# Patient Record
Sex: Female | Born: 1975 | Race: White | Hispanic: No | Smoking: Current every day smoker
Health system: Southern US, Community
[De-identification: ages and names within clinical notes are randomized; demographics above are authoritative.]

## PROBLEM LIST (undated history)

## (undated) DIAGNOSIS — I10 Essential (primary) hypertension: Secondary | ICD-10-CM

## (undated) DIAGNOSIS — J45909 Unspecified asthma, uncomplicated: Secondary | ICD-10-CM

## (undated) HISTORY — PX: OTHER SURGICAL HISTORY: SHX169

## (undated) HISTORY — PX: LEG SURGERY: SHX1003

---

## 2004-06-28 ENCOUNTER — Emergency Department: Payer: Self-pay | Admitting: General Practice

## 2004-06-29 ENCOUNTER — Other Ambulatory Visit: Payer: Self-pay

## 2005-01-27 ENCOUNTER — Ambulatory Visit: Payer: Self-pay

## 2005-08-11 ENCOUNTER — Emergency Department: Payer: Self-pay | Admitting: Unknown Physician Specialty

## 2005-11-20 ENCOUNTER — Emergency Department: Payer: Self-pay | Admitting: Emergency Medicine

## 2006-01-13 ENCOUNTER — Emergency Department: Payer: Self-pay | Admitting: Internal Medicine

## 2006-02-27 ENCOUNTER — Emergency Department: Payer: Self-pay | Admitting: Emergency Medicine

## 2006-03-27 ENCOUNTER — Emergency Department: Payer: Self-pay | Admitting: Emergency Medicine

## 2006-09-27 ENCOUNTER — Emergency Department: Payer: Self-pay | Admitting: Emergency Medicine

## 2007-03-14 ENCOUNTER — Emergency Department (HOSPITAL_COMMUNITY): Admission: EM | Admit: 2007-03-14 | Discharge: 2007-03-14 | Payer: Self-pay | Admitting: Emergency Medicine

## 2007-04-09 ENCOUNTER — Emergency Department: Payer: Self-pay | Admitting: Unknown Physician Specialty

## 2008-02-27 ENCOUNTER — Emergency Department: Payer: Self-pay | Admitting: Unknown Physician Specialty

## 2008-05-13 ENCOUNTER — Emergency Department: Payer: Self-pay | Admitting: Emergency Medicine

## 2008-05-14 ENCOUNTER — Emergency Department: Payer: Self-pay | Admitting: Emergency Medicine

## 2008-05-15 ENCOUNTER — Emergency Department: Payer: Self-pay | Admitting: Emergency Medicine

## 2008-07-01 ENCOUNTER — Emergency Department: Payer: Self-pay | Admitting: Emergency Medicine

## 2009-01-31 ENCOUNTER — Emergency Department: Payer: Self-pay

## 2009-10-05 ENCOUNTER — Emergency Department: Payer: Self-pay | Admitting: Emergency Medicine

## 2009-11-03 ENCOUNTER — Emergency Department: Payer: Self-pay | Admitting: Emergency Medicine

## 2010-12-09 ENCOUNTER — Emergency Department: Payer: Self-pay | Admitting: Emergency Medicine

## 2010-12-18 ENCOUNTER — Emergency Department: Payer: Self-pay | Admitting: Emergency Medicine

## 2011-03-07 ENCOUNTER — Emergency Department: Payer: Self-pay | Admitting: Emergency Medicine

## 2011-03-27 ENCOUNTER — Emergency Department: Payer: Self-pay | Admitting: Emergency Medicine

## 2011-06-01 ENCOUNTER — Emergency Department: Payer: Self-pay | Admitting: *Deleted

## 2012-01-02 ENCOUNTER — Emergency Department: Payer: Self-pay | Admitting: Emergency Medicine

## 2012-04-02 ENCOUNTER — Emergency Department: Payer: Self-pay | Admitting: *Deleted

## 2012-04-02 LAB — COMPREHENSIVE METABOLIC PANEL
Albumin: 3.7 g/dL (ref 3.4–5.0)
Alkaline Phosphatase: 72 U/L (ref 50–136)
Bilirubin,Total: 0.4 mg/dL (ref 0.2–1.0)
Chloride: 105 mmol/L (ref 98–107)
Co2: 27 mmol/L (ref 21–32)
Creatinine: 0.63 mg/dL (ref 0.60–1.30)
EGFR (African American): >60
EGFR (Non-African Amer.): >60
Glucose: 93 mg/dL (ref 65–99)
Osmolality: 282 (ref 275–301)
SGPT (ALT): 21 U/L
Sodium: 141 mmol/L (ref 136–145)
Total Protein: 7.4 g/dL (ref 6.4–8.2)

## 2012-04-02 LAB — CBC WITH DIFFERENTIAL/PLATELET
Basophil #: 0 10*3/uL (ref 0.0–0.1)
Basophil %: 0.4 %
Eosinophil #: 0.2 10*3/uL (ref 0.0–0.7)
HCT: 44.8 % (ref 35.0–47.0)
Lymphocyte #: 2.2 10*3/uL (ref 1.0–3.6)
MCH: 32.6 pg (ref 26.0–34.0)
MCHC: 32.6 g/dL (ref 32.0–36.0)
MCV: 100 fL (ref 80–100)
Monocyte #: 0.6 x10 3/mm (ref 0.2–0.9)
Monocyte %: 5.3 %
Neutrophil #: 7.7 10*3/uL — ABNORMAL HIGH (ref 1.4–6.5)
RDW: 15.5 % — ABNORMAL HIGH (ref 11.5–14.5)

## 2012-04-02 LAB — LIPASE, BLOOD: Lipase: 129 U/L (ref 73–393)

## 2012-07-30 ENCOUNTER — Emergency Department: Payer: Self-pay | Admitting: Emergency Medicine

## 2012-07-30 LAB — URINALYSIS, COMPLETE
Bacteria: NONE SEEN
Bilirubin,UR: NEGATIVE
Blood: NEGATIVE
Glucose,UR: NEGATIVE mg/dL (ref 0–75)
Ketone: NEGATIVE
Leukocyte Esterase: NEGATIVE
Nitrite: NEGATIVE
Ph: 6 (ref 4.5–8.0)
Protein: NEGATIVE
RBC,UR: 3 /HPF (ref 0–5)
Specific Gravity: 1.024 (ref 1.003–1.030)
Squamous Epithelial: 2
WBC UR: 1 /HPF (ref 0–5)

## 2012-07-30 LAB — COMPREHENSIVE METABOLIC PANEL
Alkaline Phosphatase: 69 U/L (ref 50–136)
BUN: 11 mg/dL (ref 7–18)
Bilirubin,Total: 0.2 mg/dL (ref 0.2–1.0)
Chloride: 106 mmol/L (ref 98–107)
Co2: 29 mmol/L (ref 21–32)
EGFR (Non-African Amer.): >60
Glucose: 87 mg/dL (ref 65–99)
Osmolality: 276 (ref 275–301)
Potassium: 4.3 mmol/L (ref 3.5–5.1)
SGPT (ALT): 28 U/L (ref 12–78)
Sodium: 139 mmol/L (ref 136–145)

## 2012-07-30 LAB — WET PREP, GENITAL

## 2012-07-30 LAB — CBC
MCHC: 32.8 g/dL (ref 32.0–36.0)
Platelet: 269 10*3/uL (ref 150–440)
RDW: 15.8 % — ABNORMAL HIGH (ref 11.5–14.5)

## 2012-12-21 ENCOUNTER — Emergency Department: Payer: Self-pay | Admitting: Emergency Medicine

## 2013-01-28 ENCOUNTER — Emergency Department: Payer: Self-pay | Admitting: Emergency Medicine

## 2013-07-12 ENCOUNTER — Emergency Department: Payer: Self-pay | Admitting: Emergency Medicine

## 2014-03-06 ENCOUNTER — Emergency Department: Payer: Self-pay | Admitting: Internal Medicine

## 2014-05-05 ENCOUNTER — Emergency Department: Payer: Self-pay | Admitting: Emergency Medicine

## 2014-05-05 LAB — URINALYSIS, COMPLETE
BILIRUBIN, UR: NEGATIVE
Blood: NEGATIVE
GLUCOSE, UR: NEGATIVE mg/dL (ref 0–75)
Ketone: NEGATIVE
LEUKOCYTE ESTERASE: NEGATIVE
Nitrite: NEGATIVE
Ph: 5 (ref 4.5–8.0)
Protein: NEGATIVE
RBC,UR: 1 /HPF (ref 0–5)
Specific Gravity: 1.02 (ref 1.003–1.030)
Squamous Epithelial: 8

## 2014-05-05 LAB — COMPREHENSIVE METABOLIC PANEL
ALK PHOS: 61 U/L
ANION GAP: 3 — AB (ref 7–16)
Albumin: 3.2 g/dL — ABNORMAL LOW (ref 3.4–5.0)
BUN: 12 mg/dL (ref 7–18)
Bilirubin,Total: 0.3 mg/dL (ref 0.2–1.0)
CHLORIDE: 111 mmol/L — AB (ref 98–107)
Calcium, Total: 8.1 mg/dL — ABNORMAL LOW (ref 8.5–10.1)
Co2: 24 mmol/L (ref 21–32)
Creatinine: 0.74 mg/dL (ref 0.60–1.30)
EGFR (Non-African Amer.): >60
GLUCOSE: 123 mg/dL — AB (ref 65–99)
Osmolality: 277 (ref 275–301)
Potassium: 4.1 mmol/L (ref 3.5–5.1)
SGOT(AST): 26 U/L (ref 15–37)
SGPT (ALT): 23 U/L
Sodium: 138 mmol/L (ref 136–145)
TOTAL PROTEIN: 6.9 g/dL (ref 6.4–8.2)

## 2014-05-05 LAB — CBC
HCT: 45.7 % (ref 35.0–47.0)
HGB: 14.6 g/dL (ref 12.0–16.0)
MCH: 32.8 pg (ref 26.0–34.0)
MCHC: 32.1 g/dL (ref 32.0–36.0)
MCV: 102 fL — ABNORMAL HIGH (ref 80–100)
Platelet: 251 10*3/uL (ref 150–440)
RBC: 4.46 10*6/uL (ref 3.80–5.20)
RDW: 14.4 % (ref 11.5–14.5)
WBC: 10.4 10*3/uL (ref 3.6–11.0)

## 2014-05-05 LAB — D-DIMER(ARMC): D-Dimer: 376 ng/ml

## 2014-05-05 LAB — LIPASE, BLOOD: Lipase: 160 U/L (ref 73–393)

## 2014-11-13 ENCOUNTER — Emergency Department: Payer: Self-pay | Admitting: Internal Medicine

## 2014-11-14 ENCOUNTER — Encounter (HOSPITAL_COMMUNITY): Payer: Self-pay | Admitting: Emergency Medicine

## 2014-11-14 ENCOUNTER — Emergency Department (HOSPITAL_COMMUNITY)
Admission: EM | Admit: 2014-11-14 | Discharge: 2014-11-14 | Disposition: A | Discharge status: Home / Self Care | Payer: Self-pay | Attending: Emergency Medicine | Admitting: Emergency Medicine

## 2014-11-14 ENCOUNTER — Emergency Department (HOSPITAL_COMMUNITY): Payer: Self-pay

## 2014-11-14 DIAGNOSIS — S63619A Unspecified sprain of unspecified finger, initial encounter: Secondary | ICD-10-CM

## 2014-11-14 DIAGNOSIS — Z720 Tobacco use: Secondary | ICD-10-CM | POA: Insufficient documentation

## 2014-11-14 DIAGNOSIS — Y998 Other external cause status: Secondary | ICD-10-CM | POA: Insufficient documentation

## 2014-11-14 DIAGNOSIS — Y9301 Activity, walking, marching and hiking: Secondary | ICD-10-CM | POA: Insufficient documentation

## 2014-11-14 DIAGNOSIS — S63612A Unspecified sprain of right middle finger, initial encounter: Secondary | ICD-10-CM | POA: Insufficient documentation

## 2014-11-14 DIAGNOSIS — Y92009 Unspecified place in unspecified non-institutional (private) residence as the place of occurrence of the external cause: Secondary | ICD-10-CM | POA: Insufficient documentation

## 2014-11-14 DIAGNOSIS — S8001XA Contusion of right knee, initial encounter: Secondary | ICD-10-CM | POA: Insufficient documentation

## 2014-11-14 DIAGNOSIS — Z79899 Other long term (current) drug therapy: Secondary | ICD-10-CM | POA: Insufficient documentation

## 2014-11-14 DIAGNOSIS — Z791 Long term (current) use of non-steroidal anti-inflammatories (NSAID): Secondary | ICD-10-CM | POA: Insufficient documentation

## 2014-11-14 DIAGNOSIS — W108XXA Fall (on) (from) other stairs and steps, initial encounter: Secondary | ICD-10-CM | POA: Insufficient documentation

## 2014-11-14 DIAGNOSIS — J45909 Unspecified asthma, uncomplicated: Secondary | ICD-10-CM | POA: Insufficient documentation

## 2014-11-14 HISTORY — DX: Unspecified asthma, uncomplicated: J45.909

## 2014-11-14 MED ORDER — IBUPROFEN 600 MG PO TABS: 600.0000 mg | ORAL_TABLET | Freq: Four times a day (QID) | ORAL | Status: DC | PRN

## 2014-11-14 MED ORDER — TRAMADOL HCL 50 MG PO TABS: 50.0000 mg | ORAL_TABLET | Freq: Four times a day (QID) | ORAL | Status: DC | PRN

## 2014-11-14 NOTE — ED Notes (Signed)
Patient states she fell last night injuring her right middle finger and right knee.

## 2014-11-14 NOTE — Discharge Instructions (Signed)
Use ice and elevation as much as  is comfortable for the next 2 days, you may add contrast baths starting on Sunday.  Use the medications prescribed, use caution with tramadol as this will make you drowsy.  Plan to follow-up with orthopedics if your symptoms are not better over the next week to 10 days.  Your x-rays are negative today.     Contusion is a deep bruise. Contusions are the ressult of an injury that caused bleeding under the skin. The contusion may turn blue, purple, or yellow. Minor injuries will give you a painless contusion, but more severe contusions may stay painful and swollen for a few weeks.  CAUSES  A contusion is usually caused by a blow, trauma, or direct force to an area of the body. SYMPTOMS   Swelling and redness of the injured area.  Bruising of the injured area.  Tenderness and soreness of the injured area.  Pain. DIAGNOSIS  The diagnosis can be made by taking a history and physical exam. An X-ray, CT scan, or MRI may be needed to determine if there were any associated injuries, such as fractures. TREATMENT  Specific treatment will depend on what area of the body was injured. In general, the best treatment for a contusion is resting, icing, elevating, and applying cold compresses to the injured area. Over-the-counter medicines may also be recommended for pain control. Ask your caregiver what the best treatment is for your contusion. HOME CARE INSTRUCTIONS   Put ice on the injured area.  Put ice in a plastic bag.  Place a towel between your skin and the bag.  Leave the ice on for 15-20 minutes, 3-4 times a day, or as directed by your health care provider.  Only take over-the-counter or prescription medicines for pain, discomfort, or fever as directed by your caregiver. Your caregiver may recommend avoiding anti-inflammatory medicines (aspirin, ibuprofen, and naproxen) for 48 hours because these medicines may increase bruising.  Rest the injured area.  If  possible, elevate the injured area to reduce swelling. SEEK IMMEDIATE MEDICAL CARE IF:   You have increased bruising or swelling.  You have pain that is getting worse.  Your swelling or pain is not relieved with medicines. MAKE SURE YOU:   Understand these instructions.  Will watch your condition.  Will get help right away if you are not doing well or get worse. Document Released: 06/08/2005 Document Revised: 09/03/2013 Document Reviewed: 07/04/2011 [facility] Patient Information 2015 [place]. This information is not intended to replace advice given to you by your health care provider. Make sure you discuss any questions you have with your health care provider.  Finger Sprain A finger sprain is a tear in one of the strong, fibrous tissues that connect the bones (ligaments) in your finger. The severity of the sprain depends on how much of the ligament is torn. The tear can be either partial or complete. CAUSES  Often, sprains are a result of a fall or accident. If you extend your hands to catch an object or to protect yourself, the force of the impact causes the fibers of your ligament to stretch too much. This excess tension causes the fibers of your ligament to tear. SYMPTOMS  You may have some loss of motion in your finger. Other symptoms include:  Bruising.  Tenderness.  Swelling. DIAGNOSIS  In order to diagnose finger sprain, your caregiver will physically examine your finger or thumb to determine how torn the ligament is. Your caregiver may also suggest  an X-ray exam of your finger to make sure no bones are broken. TREATMENT  If your ligament is only partially torn, treatment usually involves keeping the finger in a fixed position (immobilization) for a short period. To do this, your caregiver will apply a bandage, cast, or splint to keep your finger from moving until it heals. For a partially torn ligament, the healing process usually takes 2 to 3 weeks. If your ligament  is completely torn, you may need surgery to reconnect the ligament to the bone. After surgery a cast or splint will be applied and will need to stay on your finger or thumb for 4 to 6 weeks while your ligament heals. HOME CARE INSTRUCTIONS  Keep your injured finger elevated, when possible, to decrease swelling.  To ease pain and swelling, apply ice to your joint twice a day, for 2 to 3 days:  Put ice in a plastic bag.  Place a towel between your skin and the bag.  Leave the ice on for 15 minutes.  Only take over-the-counter or prescription medicine for pain as directed by your caregiver.  Do not wear rings on your injured finger.  Do not leave your finger unprotected until pain and stiffness go away (usually 3 to 4 weeks).  Do not allow your cast or splint to get wet. Cover your cast or splint with a plastic bag when you shower or bathe. Do not swim.  Your caregiver may suggest special exercises for you to do during your recovery to prevent or limit permanent stiffness. SEEK IMMEDIATE MEDICAL CARE IF:  Your cast or splint becomes damaged.  Your pain becomes worse rather than better. MAKE SURE YOU:  Understand these instructions.  Will watch your condition.  Will get help right away if you are not doing well or get worse. Document Released: 10/06/2004 Document Revised: 11/21/2011 Document Reviewed: 05/02/2011 [facility] Patient Information 2015 [place], [place]. This information is not intended to replace advice given to you by your health care provider. Make sure you discuss any questions you have with your health care provider.
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
## 2014-11-14 NOTE — ED Notes (Addendum)
Fell last night, Pain RMF . Rt knee. Alert, talking.  Rt wrist hurts also.

## 2015-11-24 ENCOUNTER — Encounter (HOSPITAL_COMMUNITY): Payer: Self-pay | Admitting: *Deleted

## 2015-11-24 ENCOUNTER — Emergency Department (HOSPITAL_COMMUNITY)
Admission: EM | Admit: 2015-11-24 | Discharge: 2015-11-24 | Disposition: A | Discharge status: Home / Self Care | Payer: Self-pay | Attending: Emergency Medicine | Admitting: Emergency Medicine

## 2015-11-24 DIAGNOSIS — Z23 Encounter for immunization: Secondary | ICD-10-CM | POA: Insufficient documentation

## 2015-11-24 DIAGNOSIS — S91202A Unspecified open wound of left great toe with damage to nail, initial encounter: Secondary | ICD-10-CM | POA: Insufficient documentation

## 2015-11-24 DIAGNOSIS — W208XXA Other cause of strike by thrown, projected or falling object, initial encounter: Secondary | ICD-10-CM | POA: Insufficient documentation

## 2015-11-24 DIAGNOSIS — J45909 Unspecified asthma, uncomplicated: Secondary | ICD-10-CM | POA: Insufficient documentation

## 2015-11-24 DIAGNOSIS — Y9389 Activity, other specified: Secondary | ICD-10-CM | POA: Insufficient documentation

## 2015-11-24 DIAGNOSIS — Y999 Unspecified external cause status: Secondary | ICD-10-CM | POA: Insufficient documentation

## 2015-11-24 DIAGNOSIS — Y929 Unspecified place or not applicable: Secondary | ICD-10-CM | POA: Insufficient documentation

## 2015-11-24 DIAGNOSIS — Z79899 Other long term (current) drug therapy: Secondary | ICD-10-CM | POA: Insufficient documentation

## 2015-11-24 DIAGNOSIS — S91209A Unspecified open wound of unspecified toe(s) with damage to nail, initial encounter: Secondary | ICD-10-CM

## 2015-11-24 DIAGNOSIS — F17210 Nicotine dependence, cigarettes, uncomplicated: Secondary | ICD-10-CM | POA: Insufficient documentation

## 2015-11-24 MED ORDER — TETANUS-DIPHTH-ACELL PERTUSSIS 5-2.5-18.5 LF-MCG/0.5 IM SUSP
0.5000 mL | Freq: Once | INTRAMUSCULAR | Status: AC
  Administered 2015-11-24: 0.5 mL via INTRAMUSCULAR

## 2015-11-24 MED ORDER — HYDROCODONE-ACETAMINOPHEN 5-325 MG PO TABS: 1.0000 | ORAL_TABLET | ORAL | Status: DC | PRN

## 2015-11-24 MED ORDER — TETANUS-DIPHTH-ACELL PERTUSSIS 5-2.5-18.5 LF-MCG/0.5 IM SUSP
INTRAMUSCULAR | Status: AC
  Filled 2015-11-24: qty 0.5

## 2015-11-24 MED ORDER — DOXYCYCLINE HYCLATE 100 MG PO CAPS: 100.0000 mg | ORAL_CAPSULE | Freq: Two times a day (BID) | ORAL | Status: DC

## 2015-11-24 NOTE — ED Notes (Signed)
Pt seen and evaluated by EDPa for initial assessment. 

## 2015-11-24 NOTE — Discharge Instructions (Signed)
Please cleanse the wound on your foot with soap and water daily. Please use a Vaseline gauze as part of the dressing to keep it from sticking. Please use the postoperative shoe until you can safely wear your regular shoes. Use doxycycline 2 times daily with food. Use Tylenol or ibuprofen for mild pain, use Norco for more severe pain. Norco may cause drowsiness, please do not drive, operate machinery, drink alcohol, or pertussis pain and activities requiring concentration when taking this medication.

## 2015-11-24 NOTE — ED Provider Notes (Signed)
CSN: [id number]     Arrival date & time 11/24/15  1157 History   First MD Initiated Contact with Patient 11/24/15 1231     Chief Complaint  Patient presents with  . Toe Injury     (Consider location/radiation/quality/duration/timing/severity/associated sxs/prior Treatment) HPI Comments: Patient is a 40 year old female who presents to the emergency department with the complaint of injury to the left first toe. Patient states that on yesterday she was moving furniture, she was moving a large table they came down on her left first toe, she injured the nail. She had a lot of bleeding present. She attempted to handle this on her own, but states that today she had significant pain and there was still some mild bleeding present. The patient is not sure of the date of her last tetanus. She denies being on any anticoagulation medications, but states she has been taking a good number of BC powders. She's not had any previous operations or procedures involving the left foot.  The history is provided by the patient.    Past Medical History  Diagnosis Date  . Asthma    Past Surgical History  Procedure Laterality Date  . Leg surgery    . Arm surgery    . Cesarean section     No family history on file. Social History  Substance Use Topics  . Smoking status: Current Every Day Smoker -- 2.00 packs/day    Types: Cigarettes  . Smokeless tobacco: None  . Alcohol Use: Yes     Comment: occasionally   OB History    No data available     Review of Systems  Musculoskeletal: Positive for arthralgias.  All other systems reviewed and are negative.     Allergies  Penicillins  Home Medications   Prior to Admission medications   Medication Sig Start Date End Date Taking? Authorizing Provider  albuterol (PROVENTIL HFA;VENTOLIN HFA) 108 (90 BASE) MCG/ACT inhaler Inhale 2 puffs into the lungs every 6 (six) hours as needed for wheezing or shortness of breath.   Yes Historical Provider, MD   Aspirin-Caffeine (BC FAST PAIN RELIEF PO) Take 1 Package by mouth daily as needed (pain).   Yes Historical Provider, MD  doxycycline (VIBRAMYCIN) 100 MG capsule Take 1 capsule (100 mg total) by mouth 2 (two) times daily. 11/24/15   Ivery QualeHobson Kealohilani Maiorino, PA-C  HYDROcodone-acetaminophen (NORCO/VICODIN) 5-325 MG tablet Take 1-2 tablets by mouth every 4 (four) hours as needed. 11/24/15   Ivery QualeHobson Rohil Lesch, PA-C   BP 140/88 mmHg  Pulse 86  Temp(Src) 98.5 F (36.9 C) (Oral)  Resp 16  Ht 5\' 7"  (1.702 m)  Wt 92.987 kg  BMI 32.10 kg/m2  SpO2 98%  LMP 11/10/2015 Physical Exam  Constitutional: She is oriented to person, place, and time. She appears well-developed and well-nourished.  Non-toxic appearance.  HENT:  Head: Normocephalic.  Right Ear: Tympanic membrane and external ear normal.  Left Ear: Tympanic membrane and external ear normal.  Eyes: EOM and lids are normal. Pupils are equal, round, and reactive to light.  Neck: Normal range of motion. Neck supple. Carotid bruit is not present.  Cardiovascular: Normal rate, regular rhythm, normal heart sounds, intact distal pulses and normal pulses.   Pulmonary/Chest: Breath sounds normal. No respiratory distress.  Abdominal: Soft. Bowel sounds are normal. There is no tenderness. There is no guarding.  Musculoskeletal: Normal range of motion.  There is full range of motion of the left ankle. The Achilles tendon is intact. There is mild swelling of the  distal first toe of the left foot. There is no injury to the other toes. The nail is partially avulsed. There is mild bleeding present. Capillary refill is less than 2 seconds all toes. The dorsalis pedis pulse is 2+.  Lymphadenopathy:       Head (right side): No submandibular adenopathy present.       Head (left side): No submandibular adenopathy present.    She has no cervical adenopathy.  Neurological: She is alert and oriented to person, place, and time. She has normal strength. No cranial nerve deficit or  sensory deficit.  Skin: Skin is warm and dry.  Psychiatric: Her speech is normal. Her mood appears anxious.  Nursing note and vitals reviewed.   ED Course  .Nail Removal Date/Time: 11/24/2015 11:57 AM Performed by: Ivery Quale Authorized by: Ivery Quale Consent: Verbal consent obtained. Risks and benefits: risks, benefits and alternatives were discussed Consent given by: patient Patient understanding: patient states understanding of the procedure being performed Patient identity confirmed: arm band Time out: Immediately prior to procedure a "time out" was called to verify the correct patient, procedure, equipment, support staff and site/side marked as required. Location: left foot Location details: left big toe Anesthesia: digital block Local anesthetic: bupivacaine 0.25% without epinephrine Patient sedated: no Preparation: skin prepped with Betadine and sterile field established Amount removed: complete Removed nail replaced and anchored: no Dressing: gauze roll Patient tolerance: Patient tolerated the procedure well with no immediate complications Comments: Pt fitted with post op shoe.   (including critical care time) Labs Review Labs Reviewed - No data to display  Imaging Review No results found. I have personally reviewed and evaluated these images and lab results as part of my medical decision-making.   EKG Interpretation None      MDM  Pt sustained injury to the left first toe nail. The nail was removed and non-stick bulky dressing applied. Pt fitted with post op shoe. Rx of doxycyxline and norco given to the patient. Discussed the importance of elevation and monitor for signs of infection.   Final diagnoses:  Toenail avulsion, initial encounter    **I have reviewed nursing notes, vital signs, and all appropriate lab and imaging results for this patient.*  [address], PA-C 11/26/15 1202  Bethann Berkshire, MD 11/26/15 1623

## 2015-11-24 NOTE — ED Notes (Signed)
Pt was moving furniture yesterday and stubbed her toe. Pt is concerned about her toe nail, states it was jammed into toe.

## 2016-02-29 ENCOUNTER — Emergency Department
Admission: EM | Admit: 2016-02-29 | Discharge: 2016-03-01 | Disposition: A | Discharge status: Home / Self Care | Payer: Self-pay | Attending: Emergency Medicine | Admitting: Emergency Medicine

## 2016-02-29 DIAGNOSIS — F1994 Other psychoactive substance use, unspecified with psychoactive substance-induced mood disorder: Secondary | ICD-10-CM

## 2016-02-29 DIAGNOSIS — F10929 Alcohol use, unspecified with intoxication, unspecified: Secondary | ICD-10-CM

## 2016-02-29 DIAGNOSIS — J45909 Unspecified asthma, uncomplicated: Secondary | ICD-10-CM | POA: Insufficient documentation

## 2016-02-29 DIAGNOSIS — R45851 Suicidal ideations: Secondary | ICD-10-CM | POA: Insufficient documentation

## 2016-02-29 DIAGNOSIS — F1010 Alcohol abuse, uncomplicated: Secondary | ICD-10-CM

## 2016-02-29 DIAGNOSIS — F17210 Nicotine dependence, cigarettes, uncomplicated: Secondary | ICD-10-CM | POA: Insufficient documentation

## 2016-02-29 DIAGNOSIS — Z79899 Other long term (current) drug therapy: Secondary | ICD-10-CM | POA: Insufficient documentation

## 2016-02-29 DIAGNOSIS — Z7982 Long term (current) use of aspirin: Secondary | ICD-10-CM | POA: Insufficient documentation

## 2016-02-29 DIAGNOSIS — F10129 Alcohol abuse with intoxication, unspecified: Secondary | ICD-10-CM | POA: Insufficient documentation

## 2016-02-29 LAB — CBC
HCT: 46.6 % (ref 35.0–47.0)
HEMOGLOBIN: 15.2 g/dL (ref 12.0–16.0)
MCH: 32.8 pg (ref 26.0–34.0)
MCHC: 32.6 g/dL (ref 32.0–36.0)
MCV: 100.5 fL — AB (ref 80.0–100.0)
PLATELETS: 280 10*3/uL (ref 150–440)
RBC: 4.64 MIL/uL (ref 3.80–5.20)
RDW: 15.5 % — ABNORMAL HIGH (ref 11.5–14.5)
WBC: 18.1 10*3/uL — ABNORMAL HIGH (ref 3.6–11.0)

## 2016-02-29 LAB — URINE DRUG SCREEN, QUALITATIVE (ARMC ONLY)
Amphetamines, Ur Screen: NOT DETECTED
BARBITURATES, UR SCREEN: NOT DETECTED
Benzodiazepine, Ur Scrn: NOT DETECTED
CANNABINOID 50 NG, UR ~~LOC~~: NOT DETECTED
COCAINE METABOLITE, UR ~~LOC~~: NOT DETECTED
MDMA (ECSTASY) UR SCREEN: NOT DETECTED
Methadone Scn, Ur: NOT DETECTED
Opiate, Ur Screen: NOT DETECTED
PHENCYCLIDINE (PCP) UR S: NOT DETECTED
Tricyclic, Ur Screen: NOT DETECTED

## 2016-02-29 LAB — COMPREHENSIVE METABOLIC PANEL
ALBUMIN: 3.9 g/dL (ref 3.5–5.0)
ALK PHOS: 67 U/L (ref 38–126)
ALT: 27 U/L (ref 14–54)
ANION GAP: 14 (ref 5–15)
AST: 26 U/L (ref 15–41)
BILIRUBIN TOTAL: 0.5 mg/dL (ref 0.3–1.2)
BUN: 11 mg/dL (ref 6–20)
CALCIUM: 8.4 mg/dL — AB (ref 8.9–10.3)
CO2: 25 mmol/L (ref 22–32)
Chloride: 99 mmol/L — ABNORMAL LOW (ref 101–111)
Creatinine, Ser: 0.78 mg/dL (ref 0.44–1.00)
GFR calc Af Amer: >60 mL/min (ref >60)
GFR calc non Af Amer: >60 mL/min (ref >60)
GLUCOSE: 149 mg/dL — AB (ref 65–99)
Potassium: 3 mmol/L — ABNORMAL LOW (ref 3.5–5.1)
SODIUM: 138 mmol/L (ref 135–145)
TOTAL PROTEIN: 7.6 g/dL (ref 6.5–8.1)

## 2016-02-29 LAB — SALICYLATE LEVEL: Salicylate Lvl: <4 mg/dL (ref 2.8–30.0)

## 2016-02-29 LAB — POCT PREGNANCY, URINE: Preg Test, Ur: NEGATIVE

## 2016-02-29 LAB — ACETAMINOPHEN LEVEL: Acetaminophen (Tylenol), Serum: <10 ug/mL — ABNORMAL LOW (ref 10–30)

## 2016-02-29 LAB — ETHANOL: Alcohol, Ethyl (B): 238 mg/dL — ABNORMAL HIGH (ref <5)

## 2016-02-29 MED ORDER — LORAZEPAM 2 MG PO TABS: 0.0000 mg | ORAL_TABLET | Freq: Four times a day (QID) | ORAL | Status: DC

## 2016-02-29 MED ORDER — LORAZEPAM 2 MG PO TABS: 0.0000 mg | ORAL_TABLET | Freq: Two times a day (BID) | ORAL | Status: DC

## 2016-02-29 MED ORDER — NICOTINE 21 MG/24HR TD PT24
21.0000 mg | MEDICATED_PATCH | Freq: Once | TRANSDERMAL | Status: DC
  Administered 2016-03-01: 21 mg via TRANSDERMAL
  Filled 2016-02-29: qty 1

## 2016-02-29 MED ORDER — POTASSIUM CHLORIDE CRYS ER 20 MEQ PO TBCR: 40.0000 meq | EXTENDED_RELEASE_TABLET | Freq: Once | ORAL | Status: DC

## 2016-02-29 MED ORDER — VITAMIN B-1 100 MG PO TABS: 100.0000 mg | ORAL_TABLET | Freq: Once | ORAL | Status: DC

## 2016-02-29 NOTE — ED Notes (Signed)
Pt states to Dr York CeriseForbach she recently had blood work performed by her PCP; pt states "my WBC always stays high even though there's not an infection". Pt also reports that her BP and HR are higher than normal at baseline when informed by Dr York CeriseForbach that IV fluids could resolve her current tachycardic HR.

## 2016-02-29 NOTE — ED Notes (Signed)
This RN and Misty StanleyLisa, ED tech went in to dress pt out. Pt laying on stretcher with dress, earrings and blue non-skid socks on. Pt told that she would have to be dressed out in hospital provided clothing. Pt stated, "The hell I will, this is some bullshit. Why the fuck didn't they do this when I came in two and a half hours ago?!" This RN expressed that she understood that the pt felt that way but that it was policy that the pt be in hospital only provided clothing as she was under IVC. Pt became more agitated yelling, "That's bullshit. People saying I'm trying to hurt myself. Where the fuck do you see any marks that I tried to hurt myself?! Come up in my house and drag me out and all I'm doing is drinking! Show me my lab work where is says that I took anything? Meanwhile I have sun poisoning and ain't no one looked at that the whole time I've been here!" All this said yelling while making broad gestures. RN again explained that she understood the patient's frustrations but that she could not remain in her own clothes while in the quad. Pt stated, "Whatever, I'm not wearing that shit. I'll go walking out of here naked." RN explained that if she chose not to wear the paper scrubs that was fine but that she could not leave the room to go to the bathroom naked. Pt started yelling again, "I can't fucking wear it! I have fucking sun poisoning! I've been here three hours and no one fucking cares about that! Fucking bandaid center" RN explained that I had not been pt's nurse up until this point. Pt then stated, "I haven't had one! When I took out my own IV and was bleeding everywhere I couldn't get one and no one knew who my damn nurse was." RN explained that be that as it may, I was her nurse now and she needed to change into the hospital provided clothing. Pt then started to rip off her clothes while muttering. When told that she had to remove her earrings, pt took them out and threw them across the room. Pt then flopped down  on the stretcher and stated, "Sure, give me something that is easier to hang myself with, that makes sense! Fucking band aid station."

## 2016-02-29 NOTE — ED Provider Notes (Signed)
[facility] Emergency Department Provider Note  ____________________________________________  Time seen: Approximately 8:29 PM  I have reviewed the triage vital signs and the nursing notes.   HISTORY  Chief Complaint Alcohol Intoxication and Suicidal  The patient is acutely intoxicated which may limit the veracity of the history  HPI Ann Delacruz is a 40 y.o. female with a long-time history of alcoholism who presents in the custody of the police after being IVC'd by family.  She drinks heavily every day and reportedly she sent her mother attacks that was interpreted as "a last will and testament".  Her sister also asserted that the patient took all of her blood pressure medicines.  She was asleep when the police arrived.  She adamantly denies wanting to kill herself though she does admit she is an alcoholic.  She admits to prior multiple suicide attempts and points to the scars, but she states adamantly that she did not try to kill her self today.  She denies fever/chills, chest pain, shortness of breath, nausea, vomiting, abdominal pain, diarrhea.  Denies any coingestions with the alcohol.   Past Medical History  Diagnosis Date  . Asthma     There are no active problems to display for this patient.   Past Surgical History  Procedure Laterality Date  . Leg surgery    . Arm surgery    . Cesarean section      Current Outpatient Rx  Name  Route  Sig  Dispense  Refill  . albuterol (PROVENTIL HFA;VENTOLIN HFA) 108 (90 BASE) MCG/ACT inhaler   Inhalation   Inhale 2 puffs into the lungs every 6 (six) hours as needed for wheezing or shortness of breath.         . Aspirin-Caffeine (BC FAST PAIN RELIEF PO)   Oral   Take 1 Package by mouth daily as needed (pain).         Marland Kitchen. doxycycline (VIBRAMYCIN) 100 MG capsule   Oral   Take 1 capsule (100 mg total) by mouth 2 (two) times daily.   14 capsule   0   . HYDROcodone-acetaminophen (NORCO/VICODIN)  5-325 MG tablet   Oral   Take 1-2 tablets by mouth every 4 (four) hours as needed.   16 tablet   0     Allergies Penicillins  No family history on file.  Social History Social History  Substance Use Topics  . Smoking status: Current Every Day Smoker -- 2.00 packs/day    Types: Cigarettes  . Smokeless tobacco: None  . Alcohol Use: Yes     Comment: occasionally    Review of Systems Constitutional: No fever/chills Eyes: No visual changes. ENT: No sore throat. Cardiovascular: Denies chest pain. Respiratory: Denies shortness of breath. Gastrointestinal: No abdominal pain.  No nausea, no vomiting.  No diarrhea.  No constipation. Genitourinary: Negative for dysuria. Musculoskeletal: Negative for back pain. Skin: Negative for rash. Neurological: Negative for headaches, focal weakness or numbness. Psych:  Alcoholic, but denies SI  10-point ROS otherwise negative.  ____________________________________________   PHYSICAL EXAM:  VITAL SIGNS: ED Triage Vitals  Enc Vitals Group     BP 02/29/16 2021 159/104 mmHg     Pulse Rate 02/29/16 2021 122     Resp 02/29/16 2021 22     Temp 02/29/16 2021 99.3 F (37.4 C)     Temp Source 02/29/16 2021 Oral     SpO2 02/29/16 2021 96 %     Weight 02/29/16 2021 207 lb (93.895 kg)  Height 02/29/16 2021  (1.676 m)     Head Cir --      Peak Flow --      Pain Score 02/29/16 2022 0     Pain Loc --      Pain Edu? --      Excl. in GC? --     Constitutional: Alert, Upset and tearful, no acute distress otherwise Eyes: Conjunctivae are normal. PERRL. EOMI. Head: Atraumatic. Nose: No congestion/rhinnorhea. Mouth/Throat: Mucous membranes are moist.  Oropharynx non-erythematous. Neck: No stridor.  No meningeal signs.   Cardiovascular: Tachycardia, regular rhythm. Good peripheral circulation. Grossly normal heart sounds.   Respiratory: Normal respiratory effort.  No retractions. Lungs CTAB. Gastrointestinal: Soft and nontender. No  distention.  Musculoskeletal: No lower extremity tenderness nor edema. No gross deformities of extremities. Neurologic:  Slightly slurred speech and language. No gross focal neurologic deficits are appreciated.  Skin:  Skin is warm, dry and intact. No rash noted. Psychiatric: The patient is angry and upset but is adamantly denying suicidal ideation.  She is slightly slurring her speech but is coherent making sense.  ____________________________________________   LABS (all labs ordered are listed, but only abnormal results are displayed)  Labs Reviewed  COMPREHENSIVE METABOLIC PANEL  ETHANOL  CBC  URINE DRUG SCREEN, QUALITATIVE ([facility] ONLY)   ____________________________________________  EKG  ED ECG REPORT I, Derrek Puff, the attending physician, personally viewed and interpreted this ECG.  Date: 02/29/2016 EKG Time: 20:18 Rate: 130 Rhythm: Sinus tachycardia QRS Axis: normal Intervals: normal ST/T Wave abnormalities: normal Conduction Disturbances: none Narrative Interpretation: unremarkable  ____________________________________________  RADIOLOGY   No results found.  ____________________________________________   PROCEDURES  Procedure(s) performed:   Procedures   ____________________________________________   INITIAL IMPRESSION / ASSESSMENT AND PLAN / ED COURSE  Pertinent labs & imaging results that were available during my care of the patient were reviewed by me and considered in my medical decision making (see chart for details).  The patient was brought in under involuntary commitment which I will uphold.  She is coherent and telling me that her white blood cell count is always elevated and she has been evaluated thoroughly and recently identified no issue.  She also told me that she always has a rapid heart rate and that she has been checked for thyroid dysfunction and other issues that may cause a rapid heart rate and everything is normal.  She does  not want IV fluids at this time so I will not force them upon her.  I have placed a consult to TTS and psych.  I placed CIWA orders and am giving her a dose of thiamine.   ____________________________________________  FINAL CLINICAL IMPRESSION(S) / ED DIAGNOSES  Final diagnoses:  None     MEDICATIONS GIVEN DURING THIS VISIT:  Medications - No data to display   NEW OUTPATIENT MEDICATIONS STARTED DURING THIS VISIT:  New Prescriptions   No medications on file      Note:  This document was prepared using Dragon voice recognition software and may include unintentional dictation errors.   Loleta Rose, MD 02/29/16 2053

## 2016-02-29 NOTE — BH Assessment (Signed)
Assessment Note  Ann Delacruz is an 40 y.o. female. Ann Delacruz arrived to the ED [place] [facility] [place] [place] [facility].  She reports that she was intoxicated, and stated that "I am an alcoholic".  She reports that she was asleep in bed when "The law" arrived. She reports that she told them to leave and let her sleep it off.  She reports that papers were taken out on her and "here I am".  She denied suicidal ideation or intent.  She reports that she has no intent of killing herself.  She denied telling others that she would kill herself.  She states "I am an alcoholic, but I don't want to kill myself".  She denied symptoms of depression. She reports a history of anxiety.  She states that she has not been able to take anything because she drinks alcohol.  She denied auditory or visual hallucinations.  Denied suicidal ideation or intent. She denied homicidal ideation or intent.  She reports that she is currently seeking assistance for her alcohol through her primary care doctor.   Diagnosis: Alcohol Abuse  Past Medical History:  Past Medical History  Diagnosis Date  . Asthma     Past Surgical History  Procedure Laterality Date  . Leg surgery    . Arm surgery    . Cesarean section      Family History: No family history on file.  Social History:  reports that she has been smoking Cigarettes.  She has been smoking about 2.00 packs per day. She does not have any smokeless tobacco history on file. She reports that she drinks alcohol. She reports that she does not use illicit drugs.  Additional Social History:  Alcohol / Drug Use History of alcohol / drug use?: Yes Substance #1 Name of Substance 1: Alcohol 1 - Age of First Use: 30 1 - Amount (size/oz): 12 pk 1 - Frequency: daily 1 - Last Use / Amount: 02/29/2016  CIWA: CIWA-Ar BP: 139/69 mmHg Pulse Rate: (!) 127 COWS:    Allergies:  Allergies  Allergen Reactions  . Penicillins     Childhood allergy    Home  Medications:  (Not in a hospital admission)  OB/GYN Status:  No LMP recorded.  General Assessment Data [facility] Assessment: In system Is this a Tele or Face-to-Face Assessment?: Face-to-Face Is this an Initial Assessment or a Re-assessment for this encounter?: Initial Assessment Marital status: Separated Maiden name: Margo AyeHall Is patient pregnant?: No Pregnancy Status: No Living Arrangements: Alone Can pt return to current living arrangement?: Yes Admission Status: Involuntary Is patient capable of signing voluntary admission?: Yes Referral Source: Other Insurance type: n/a (refused to identify)  Medical Screening Exam [facility] Walk-in ONLY) Medical Exam completed: Yes  Crisis Care Plan Living Arrangements: Alone Legal Guardian: Other: (Self) Name of Psychiatrist: None Name of Therapist: None  Education Status Is patient currently in school?: No Current Grade: n/a Highest grade of school patient has completed: 12th Name of [employer]: [employer] Contact person: n/a  Risk to self with the past 6 months Suicidal Ideation: No Has patient been a risk to self within the past 6 months prior to admission? : No Suicidal Intent: No Has patient had any suicidal intent within the past 6 months prior to admission? : No Is patient at risk for suicide?: No Suicidal Plan?: No Has patient had any suicidal plan within the past 6 months prior to admission? : No Access to Means: No What has been your  use of drugs/alcohol within the last 12 months?: daily use of alcohol Previous Attempts/Gestures: Yes How many times?: 1 Other Self Harm Risks: denied Triggers for Past Attempts: None known Intentional Self Injurious Behavior: None Family Suicide History: No Recent stressful life event(s): Other (Comment) (Separated from husband, ) Persecutory voices/beliefs?: No Depression: No Depression Symptoms:  (denied) Substance abuse history and/or treatment for substance abuse?:  Yes Suicide prevention information given to non-admitted patients: Not applicable  Risk to Others within the past 6 months Homicidal Ideation: No Does patient have any lifetime risk of violence toward others beyond the six months prior to admission? : No Thoughts of Harm to Others: No Current Homicidal Intent: No Current Homicidal Plan: No Access to Homicidal Means: No Identified Victim: None identified History of harm to others?: No Assessment of Violence: None Noted Violent Behavior Description: denied Does patient have access to weapons?: No Criminal Charges Pending?: No Does patient have a court date: No Is patient on probation?: No  Psychosis Hallucinations: None noted Delusions: None noted  Mental Status Report Appearance/Hygiene: In hospital gown Eye Contact: Good Motor Activity: Unremarkable Speech: Logical/coherent Level of Consciousness: Alert Mood: Irritable Affect: Irritable Anxiety Level: None Thought Processes: Coherent Judgement: Unimpaired Orientation: Person, Place, Situation Obsessive Compulsive Thoughts/Behaviors: None  Cognitive Functioning Concentration: Decreased Memory: Recent Intact IQ: Average Insight: Fair Impulse Control: Fair Appetite: Good Sleep: No Change Vegetative Symptoms: None  ADLScreening [facility] Assessment Services) Patient's cognitive ability adequate to safely complete daily activities?: Yes Patient able to express need for assistance with ADLs?: Yes Independently performs ADLs?: Yes (appropriate for developmental age)  Prior Inpatient Therapy Prior Inpatient Therapy: No Prior Therapy Dates: n/a Prior Therapy Facilty/Provider(s): n/a Reason for Treatment: n/a  Prior Outpatient Therapy Prior Outpatient Therapy: Yes Prior Therapy Dates: 2015 Prior Therapy Facilty/Provider(s): [facility] Reason for Treatment: Alcohol abuse Does patient have an ACCT team?: No Does patient have Intensive In-House Services?  : No Does patient  have [facility] services? : No Does patient have P4CC services?: No  ADL Screening (condition at time of admission) Patient's cognitive ability adequate to safely complete daily activities?: Yes Patient able to express need for assistance with ADLs?: Yes Independently performs ADLs?: Yes (appropriate for developmental age)             [employer] officer (For Healthcare) Does patient have an advance directive?: Yes Type of Advance Directive: Healthcare Power of Attorney Does patient want to make changes to advanced directive?: No - Patient declined Copy of advanced directive(s) in chart?: No - copy requested    Additional Information 1:1 In Past 12 Months?: No CIRT Risk: No Elopement Risk: No Does patient have medical clearance?: Yes     Disposition:  Disposition Initial Assessment Completed for this Encounter: Yes Disposition of Patient: Other dispositions  On Site Evaluation by:   Reviewed with Physician:    Justice Deeds 02/29/2016 11:01 PM

## 2016-02-29 NOTE — ED Notes (Signed)
Pt arrives to ED via [facility] under IVC for reports of ETOH intoxications and SI and comments. [facility] reports they were called to residence by pt's sister because pt told her sister she took "all her BP meds". [facility] states pt sent a "last will and testament to her mother". Pt denies taking more BP meds than prescribed, states she only took 1/2 a tablet of HCTZ. Pt arrives in custody of [facility] in forensic restraints, tearful and agitated. [facility] removed forensic restraints upon arrival to room.

## 2016-02-29 NOTE — ED Notes (Signed)
Pt is comfortable, relaxed and much more cooperative at this time. Pt asking about length of stay and treatment plan; informed pt of orders placed for TTS and Psychiatry consult by Dr York CeriseForbach to assessment pt for possible inpatient recommendations or possible d/c for outpatient t/x. Pt agreeable to plan and voices no other concerns.

## 2016-03-01 DIAGNOSIS — F1010 Alcohol abuse, uncomplicated: Secondary | ICD-10-CM

## 2016-03-01 DIAGNOSIS — F1994 Other psychoactive substance use, unspecified with psychoactive substance-induced mood disorder: Secondary | ICD-10-CM

## 2016-03-01 DIAGNOSIS — R45851 Suicidal ideations: Secondary | ICD-10-CM

## 2016-03-01 NOTE — ED Notes (Signed)
BEHAVIORAL HEALTH ROUNDING  Patient sleeping: Yes Patient alert and oriented: Sleeping Behavior appropriate: Yes. ; If no, describe:  Nutrition and fluids offered: No, sleeping  Toileting and hygiene offered: No, sleeping  Sitter present: q15 minute observations and security monitoring  Law enforcement present: Yes ODS 

## 2016-03-01 NOTE — ED Notes (Signed)
Sheet placed on pt bed and pt given blanket. Pt given lotion to put on sunburn. Pt has clothes off but states they hurt rubbing against her sunburn. Pt was informed scrubs have to be on when she comes out of room or opens her door.

## 2016-03-01 NOTE — ED Provider Notes (Addendum)
Progress note  1:11 PM 03/01/16 Patient was seen by Dr. Delaney MeigslayPACS in the emergency department. Patient is no longer intoxicated and is no longer suicidal homicidal. Dr. Mat Carnelay PACS is releasing her to go home and follow-[facility]. Patient was told to return immediately if condition worsens. Dr. Delaney MeigslayPACS rescinded her IVC papers.  Ann CarryLinda M Simran Bomkamp, MD 03/01/16 1313  Ann CarryLinda M Samaad Hashem, MD 03/01/16 [phone]

## 2016-03-01 NOTE — ED Provider Notes (Signed)
-----------------------------------------   5:54 AM on 03/01/2016 -----------------------------------------   Blood pressure 139/69, pulse 127, temperature 99.3 F (37.4 C), temperature source Oral, resp. rate 15, height 5\' 6"  (1.676 m), weight 207 lb (93.895 kg), SpO2 95 %.  The patient had no acute events since last update.  Calm and cooperative at this time.  Disposition is pending per Psychiatry/Behavioral Medicine team recommendations.   Patient had IVC paperwork filled out by the family but here is denied direct suicidal intention though she gives vague responses on occasion abound tense to harm herself. She will require further psychiatric evaluation Fresh vital signs are pending  Jennye MoccasinBrian S Jovonne Wilton, MD 03/01/16 [phone]
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
## 2016-03-01 NOTE — ED Notes (Signed)
Pt laying in bed with no clothes on, states "it hurts my sunburn too bad".

## 2016-03-01 NOTE — ED Notes (Signed)
Pt given breakfast tray and phone to call in at work. RN notified pt requested nicotine patch.

## 2016-09-23 ENCOUNTER — Emergency Department: Payer: Self-pay

## 2016-09-23 ENCOUNTER — Encounter: Payer: Self-pay | Admitting: Emergency Medicine

## 2016-09-23 ENCOUNTER — Emergency Department
Admission: EM | Admit: 2016-09-23 | Discharge: 2016-09-23 | Disposition: A | Discharge status: Home / Self Care | Payer: Self-pay | Attending: Emergency Medicine | Admitting: Emergency Medicine

## 2016-09-23 DIAGNOSIS — S93432A Sprain of tibiofibular ligament of left ankle, initial encounter: Secondary | ICD-10-CM | POA: Insufficient documentation

## 2016-09-23 DIAGNOSIS — S93401A Sprain of unspecified ligament of right ankle, initial encounter: Secondary | ICD-10-CM | POA: Insufficient documentation

## 2016-09-23 DIAGNOSIS — Y939 Activity, unspecified: Secondary | ICD-10-CM | POA: Insufficient documentation

## 2016-09-23 DIAGNOSIS — S93492A Sprain of other ligament of left ankle, initial encounter: Secondary | ICD-10-CM

## 2016-09-23 DIAGNOSIS — Y999 Unspecified external cause status: Secondary | ICD-10-CM | POA: Insufficient documentation

## 2016-09-23 DIAGNOSIS — Y929 Unspecified place or not applicable: Secondary | ICD-10-CM | POA: Insufficient documentation

## 2016-09-23 DIAGNOSIS — J45909 Unspecified asthma, uncomplicated: Secondary | ICD-10-CM | POA: Insufficient documentation

## 2016-09-23 DIAGNOSIS — F17210 Nicotine dependence, cigarettes, uncomplicated: Secondary | ICD-10-CM | POA: Insufficient documentation

## 2016-09-23 DIAGNOSIS — W109XXA Fall (on) (from) unspecified stairs and steps, initial encounter: Secondary | ICD-10-CM | POA: Insufficient documentation

## 2016-09-23 MED ORDER — NAPROXEN 500 MG PO TABS: 500.0000 mg | ORAL_TABLET | Freq: Two times a day (BID) | ORAL | 0 refills | Status: DC

## 2016-09-23 MED ORDER — TRAMADOL HCL 50 MG PO TABS: 50.0000 mg | ORAL_TABLET | Freq: Four times a day (QID) | ORAL | 0 refills | Status: DC | PRN
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
## 2016-09-23 NOTE — ED Triage Notes (Signed)
Tripped down steps , twisting bilateral ankles , right worse than the other , swelling noted , pt ambulatory with a limp

## 2017-07-05 ENCOUNTER — Ambulatory Visit: Payer: Self-pay | Attending: Oncology | Admitting: *Deleted

## 2017-07-05 ENCOUNTER — Ambulatory Visit
Admission: RE | Admit: 2017-07-05 | Discharge: 2017-07-05 | Disposition: A | Discharge status: Home / Self Care | Payer: Self-pay | Source: Ambulatory Visit | Attending: Oncology | Admitting: Oncology

## 2017-07-05 ENCOUNTER — Encounter: Payer: Self-pay | Admitting: *Deleted

## 2017-07-05 VITALS — BP 131/89 | HR 86 | Temp 98.2°F | Resp 18 | Ht 67.0 in | Wt 193.0 lb

## 2017-07-05 DIAGNOSIS — Z0000 Encounter for general adult medical examination without abnormal findings: Secondary | ICD-10-CM

## 2017-07-05 NOTE — Patient Instructions (Signed)
HPV Test The human papillomavirus (HPV) test is used to look for high-risk types of HPV infection. HPV is a group of about 100 viruses. Many of these viruses cause growths on, in, or around the genitals. Most HPV viruses cause infections that usually go away without treatment. However, HPV types 6, 11, 16, and 18 are considered high-risk types of HPV that can increase your risk of cancer of the cervix or anus if the infection is left untreated. An HPV test identifies the DNA (genetic) strands of the HPV infection, so it is also referred to as the HPV DNA test. Although HPV is found in both males and females, the HPV test is only used to screen for increased cancer risk in females:  With an abnormal Pap test.  After treatment of an abnormal Pap test.  Between the ages of 30 and 65.  After treatment of a high-risk HPV infection. The HPV test may be done at the same time as a pelvic exam and Pap test in females over the age of 30. Both the HPV test and Pap test require a sample of cells from the cervix. How do I prepare for this test?  Do not douche or take a bath for 24-48 hours before the test or as directed by your health care provider.  Do not have sex for 24-48 hours before the test or as directed by your health care provider.  You may be asked to reschedule the test if you are menstruating.  You will be asked to urinate before the test. What do the results mean? It is your responsibility to obtain your test results. Ask the lab or department performing the test when and how you will get your results. Talk with your health care provider if you have any questions about your results. Your result will be negative or positive. Meaning of Negative Test Results  A negative HPV test result means that no HPV was found, and it is very likely that you do not have HPV. Meaning of Positive Test Results  A positive HPV test result indicates that you have HPV.  If your test result shows the presence  of any high-risk HPV strains, you may have an increased risk of developing cancer of the cervix or anus if the infection is left untreated.  If any low-risk HPV strains are found, you are not likely to have an increased risk of cancer. Discuss your test results with your health care provider. He or she will use the results to make a diagnosis and determine a treatment plan that is right for you. Talk with your health care provider to discuss your results, treatment options, and if necessary, the need for more tests. Talk with your health care provider if you have any questions about your results. This information is not intended to replace advice given to you by your health care provider. Make sure you discuss any questions you have with your health care provider. Document Released: 09/23/2004 Document Revised: 05/04/2016 Document Reviewed: 01/14/2014 Elsevier Interactive Patient Education  2017 [employer]vier [employer]  Gave patient hand-out, Women Staying Healthy, Active and Well from [facility], with education on breast health, pap smears, heart and colon health.  

## 2017-07-05 NOTE — Progress Notes (Signed)
Subjective:     Patient ID: Ann Delacruz, female   DOB: 1976-03-20, 41 y.o.   MRN: [id number]  HPI   Review of Systems     Objective:   Physical Exam  Pulmonary/Chest: Right breast exhibits no inverted nipple, no mass, no nipple discharge, no skin change and no tenderness. Left breast exhibits no inverted nipple, no mass, no nipple discharge, no skin change and no tenderness. Breasts are symmetrical.    Abdominal: There is no splenomegaly or hepatomegaly.  Genitourinary: Rectal exam shows no mass. No labial fusion. There is no rash, tenderness, lesion or injury on the right labia. There is no rash, tenderness, lesion or injury on the left labia. Cervix exhibits no motion tenderness, no discharge and no friability. Right adnexum displays no mass, no tenderness and no fullness. Left adnexum displays no mass, no tenderness and no fullness. No erythema, tenderness or bleeding in the vagina. No foreign body in the vagina. No signs of injury around the vagina. No vaginal discharge found.         Assessment:     41 year old White female referred to [facility] by Karie FetchNgwe Aycock, MD at the [facility] for clinical breast exam, pap and mammogram.  Patient complains of feeling a "pea like" nodule at 12:00 right breast.  On clinical breast exam there is scattered fibroglandular like tissue bilateral.  There is no dominant mass, skin changes, nipple discharge or lymphadenopathy.  The patient is unable to find the area of concern today.  Taught self breast awareness.  If the nodule returns she can call us and we can reassess her at that time.  Last pap was greater than 10 years ago.  Specimen collected for pap smear without difficulty.  Patient states she lost her 40 year old daughter a year ago.  Tearful.  Offered support.  Patient has been screened for eligibility.  She does not have any insurance, Medicare or Medicaid.  She also meets financial eligibility.  Hand-out given on the Affordable Care  Act.    Plan:     Screening mammogram ordered.  Specimen for pap smear sent to the lab.  Will follow-up per [facility] protocol.

## 2017-07-10 LAB — PAP LB AND HPV HIGH-RISK
HPV, high-risk: NEGATIVE
PAP Smear Comment: 0

## 2017-07-11 NOTE — Progress Notes (Unsigned)
Letter mailed from [facility] to notify of normal mammogram results.  Patient to return in one year for annual screening.  Phoned patient with negative /negative pap results, Notified of trichomonas result.  Prescription for Metronidazole 2 grams or 4 tablets [facility] [place].  Next pap due in 5 years. Copy to [facility].

## 2017-08-14 ENCOUNTER — Emergency Department: Payer: Self-pay

## 2017-08-14 ENCOUNTER — Other Ambulatory Visit: Payer: Self-pay

## 2017-08-14 ENCOUNTER — Encounter: Payer: Self-pay | Admitting: Emergency Medicine

## 2017-08-14 ENCOUNTER — Emergency Department
Admission: EM | Admit: 2017-08-14 | Discharge: 2017-08-14 | Disposition: A | Discharge status: Home / Self Care | Payer: Self-pay | Attending: Student in an Organized Health Care Education/Training Program | Admitting: Student in an Organized Health Care Education/Training Program

## 2017-08-14 DIAGNOSIS — J45909 Unspecified asthma, uncomplicated: Secondary | ICD-10-CM | POA: Insufficient documentation

## 2017-08-14 DIAGNOSIS — R58 Hemorrhage, not elsewhere classified: Secondary | ICD-10-CM | POA: Insufficient documentation

## 2017-08-14 DIAGNOSIS — F17210 Nicotine dependence, cigarettes, uncomplicated: Secondary | ICD-10-CM | POA: Insufficient documentation

## 2017-08-14 DIAGNOSIS — Z79899 Other long term (current) drug therapy: Secondary | ICD-10-CM | POA: Insufficient documentation

## 2017-08-14 DIAGNOSIS — R202 Paresthesia of skin: Secondary | ICD-10-CM | POA: Insufficient documentation

## 2017-08-14 DIAGNOSIS — M79604 Pain in right leg: Secondary | ICD-10-CM | POA: Insufficient documentation

## 2017-08-14 NOTE — ED Provider Notes (Signed)
[facility] Emergency Department Provider Note    First MD Initiated Contact with Patient 08/14/17 1110     (approximate)  I have reviewed the triage vital signs and the nursing notes.   HISTORY  Chief Complaint Post op complication and Calf pain    HPI Ann Delacruz is a 41 y.o. female who presents for evaluation of right calf pain roughly 3 weeks status post lipoma resection with tendon repair at [facility].  States she was doing well postoperatively and has been healing faster than the expected.  States that on things giving days she did start walking more and was helping with the festivities.  The following day she started noticing aching and bruising in her right calf.  Presents today because she was having aching pain and intermittent tingling sensation.  Patient is primarily concerned that she has a blood clot.  No personal history of blood clots.  States that the pain is mild in nature without any radiation.  Past Medical History:  Diagnosis Date  . Asthma    No family history on file. Past Surgical History:  Procedure Laterality Date  . arm surgery    . CESAREAN SECTION    . LEG SURGERY     Patient Active Problem List   Diagnosis Date Noted  . Substance induced mood disorder (HCC) 03/01/2016  . Alcohol abuse 03/01/2016  . Suicidal ideation 03/01/2016      Prior to Admission medications   Medication Sig Start Date End Date Taking? Authorizing Provider  albuterol (PROVENTIL HFA;VENTOLIN HFA) 108 (90 BASE) MCG/ACT inhaler Inhale 2 puffs into the lungs every 6 (six) hours as needed for wheezing or shortness of breath.    [provider]  Aspirin-Caffeine (BC FAST PAIN RELIEF PO) Take 1 Package by mouth daily as needed (pain).    [provider]  doxycycline (VIBRAMYCIN) 100 MG capsule Take 1 capsule (100 mg total) by mouth 2 (two) times daily. 11/24/15   Ivery QualeBryant, Hobson, PA-C  HYDROcodone-acetaminophen  (NORCO/VICODIN) 5-325 MG tablet Take 1-2 tablets by mouth every 4 (four) hours as needed. 11/24/15   Ivery QualeBryant, Hobson, PA-C  naproxen (NAPROSYN) 500 MG tablet Take 1 tablet (500 mg total) by mouth 2 (two) times daily with a meal. 09/23/16   Triplett, Cari B, FNP  traMADol (ULTRAM) 50 MG tablet Take 1 tablet (50 mg total) by mouth every 6 (six) hours as needed. 09/23/16   Chinita Pesterriplett, Cari B, FNP    Allergies Penicillins    Social History Social History   Tobacco Use  . Smoking status: Current Every Day Smoker    Packs/day: 2.00    Types: Cigarettes  . Smokeless tobacco: Never Used  Substance Use Topics  . Alcohol use: Yes    Comment: occasionally  . Drug use: No    Review of Systems Patient denies headaches, rhinorrhea, blurry vision, numbness, shortness of breath, chest pain, edema, cough, abdominal pain, nausea, vomiting, diarrhea, dysuria, fevers, rashes or hallucinations unless otherwise stated above in HPI. ____________________________________________   PHYSICAL EXAM:  VITAL SIGNS: Vitals:   08/14/17 0949  BP: (!) 146/78  Pulse: 88  Resp: 18  Temp: 99.3 F (37.4 C)  SpO2: 99%    Constitutional: Alert and oriented. Well appearing and in no acute distress. Eyes: Conjunctivae are normal.  Head: Atraumatic. Nose: No congestion/rhinnorhea. Mouth/Throat: Mucous membranes are moist.   Neck: No stridor. Painless ROM.  Cardiovascular: Normal rate, regular rhythm. Good peripheral circulation. Respiratory: Normal respiratory effort.  No  retractions. Lungs CTAB. Gastrointestinal: Soft and nontender. No distention. No abdominal bruits. No CVA tenderness. Genitourinary:  Musculoskeletal: No joint effusion.  Posterior surgical incision is clean dry and intact with no purulence or erythema.  Strong palpable PT and DP pulses bilaterally with brisk cap refill.  There is some subacute appearing ecchymosis in the right posterior calf but the compartment is soft.  Neurovascularly  otherwise intact distally.. Neurologic:  Normal speech and language. No gross focal neurologic deficits are appreciated. No facial droop Skin:  Skin is warm, dry and intact. No rash noted. Psychiatric: Mood and affect are normal. Speech and behavior are normal.  ____________________________________________   LABS (all labs ordered are listed, but only abnormal results are displayed)  No results found for this or any previous visit (from the past 24 hour(s)). ____________________________________________  ____________________________________________  RADIOLOGY  I personally reviewed all radiographic images ordered to evaluate for the above acute complaints and reviewed radiology reports and findings.  These findings were personally discussed with the patient.  Please see medical record for radiology report.  ____________________________________________   PROCEDURES  Procedure(s) performed:  Procedures    Critical Care performed: no ____________________________________________   INITIAL IMPRESSION / ASSESSMENT AND PLAN / ED COURSE  Pertinent labs & imaging results that were available during my care of the patient were reviewed by me and considered in my medical decision making (see chart for details).  DDX: dvt, contusion, limb ischemia, claudication  Ann Delacruz is a 41 y.o. who presents to the ED with symptoms as described above.  She is afebrile and hemodynamically stable.  Physical exam as above.  This not clinically consistent with acute limb ischemia or claudication.  Lower extremity ultrasound shows no evidence of DVT.  Most consistent with contusion.  No evidence of infectious process.  Patient stable and appropriate for follow-up with orthopedics.      ____________________________________________   FINAL CLINICAL IMPRESSION(S) / ED DIAGNOSES  Final diagnoses:  Leg pain, right  Ecchymosis      NEW MEDICATIONS STARTED DURING THIS VISIT:  This  SmartLink is deprecated. Use AVSMEDLIST instead to display the medication list for a patient.   Note:  This document was prepared using Dragon voice recognition software and may include unintentional dictation errors.    Willy Eddyobinson, Zaeem Kandel, MD 08/14/17 1120

## 2017-08-14 NOTE — Discharge Instructions (Signed)
Keep leg elevated.  Follow-up with orthopedics.  Return for worsening swelling, fevers worsening pain or for any other concerns or questions.

## 2017-08-14 NOTE — ED Triage Notes (Signed)
Pt reports had a tendon and lipoma removed from right leg 07/22/17. Pt reports right calf pain and discoloration for several days. Pt reports numbness and tingling in right toes. Right posterior tib pulse strong, right pedal pulse faint, toes cool to touch, demonstrates movement of toes, capillary refill brisk.

## 2018-09-27 ENCOUNTER — Other Ambulatory Visit: Payer: Self-pay | Admitting: Family Medicine

## 2018-09-27 ENCOUNTER — Other Ambulatory Visit (HOSPITAL_COMMUNITY): Payer: Self-pay | Admitting: Family Medicine

## 2018-09-27 DIAGNOSIS — R1011 Right upper quadrant pain: Secondary | ICD-10-CM

## 2018-10-03 ENCOUNTER — Ambulatory Visit
Admission: RE | Admit: 2018-10-03 | Discharge: 2018-10-03 | Disposition: A | Discharge status: Home / Self Care | Payer: 59 | Source: Ambulatory Visit | Attending: Family Medicine | Admitting: Family Medicine

## 2018-10-03 DIAGNOSIS — R1011 Right upper quadrant pain: Secondary | ICD-10-CM | POA: Insufficient documentation

## 2018-10-10 ENCOUNTER — Other Ambulatory Visit (HOSPITAL_COMMUNITY): Payer: Self-pay | Admitting: Family Medicine

## 2018-10-10 ENCOUNTER — Other Ambulatory Visit: Payer: Self-pay | Admitting: Family Medicine

## 2018-10-10 ENCOUNTER — Other Ambulatory Visit (HOSPITAL_COMMUNITY): Payer: Self-pay | Admitting: Physician Assistant

## 2018-10-10 DIAGNOSIS — R221 Localized swelling, mass and lump, neck: Secondary | ICD-10-CM

## 2018-10-10 DIAGNOSIS — K769 Liver disease, unspecified: Secondary | ICD-10-CM

## 2018-10-26 ENCOUNTER — Ambulatory Visit: Admission: RE | Admit: 2018-10-26 | Payer: 59 | Source: Ambulatory Visit

## 2018-10-26 ENCOUNTER — Ambulatory Visit: Payer: 59

## 2018-11-09 ENCOUNTER — Ambulatory Visit
Admission: RE | Admit: 2018-11-09 | Discharge: 2018-11-09 | Disposition: A | Discharge status: Home / Self Care | Payer: 59 | Source: Ambulatory Visit | Attending: Family Medicine | Admitting: Family Medicine

## 2018-11-09 ENCOUNTER — Ambulatory Visit
Admission: RE | Admit: 2018-11-09 | Discharge: 2018-11-09 | Disposition: A | Discharge status: Home / Self Care | Payer: 59 | Source: Ambulatory Visit | Attending: Physician Assistant | Admitting: Physician Assistant

## 2018-11-09 DIAGNOSIS — K769 Liver disease, unspecified: Secondary | ICD-10-CM | POA: Diagnosis present

## 2018-11-09 DIAGNOSIS — R221 Localized swelling, mass and lump, neck: Secondary | ICD-10-CM | POA: Insufficient documentation

## 2018-11-09 MED ORDER — IOHEXOL 300 MG/ML  SOLN
100.0000 mL | Freq: Once | INTRAMUSCULAR | Status: AC | PRN
  Administered 2018-11-09: 100 mL via INTRAVENOUS

## 2019-11-13 ENCOUNTER — Other Ambulatory Visit: Payer: Self-pay | Admitting: Internal Medicine

## 2019-11-13 DIAGNOSIS — Z1231 Encounter for screening mammogram for malignant neoplasm of breast: Secondary | ICD-10-CM

## 2019-11-14 ENCOUNTER — Other Ambulatory Visit: Payer: Self-pay | Admitting: Internal Medicine

## 2019-11-14 DIAGNOSIS — R079 Chest pain, unspecified: Secondary | ICD-10-CM

## 2019-11-22 ENCOUNTER — Other Ambulatory Visit: Payer: Self-pay

## 2019-11-22 ENCOUNTER — Ambulatory Visit
Admission: RE | Admit: 2019-11-22 | Discharge: 2019-11-22 | Disposition: A | Discharge status: Home / Self Care | Payer: BC Managed Care – PPO | Source: Ambulatory Visit | Attending: Internal Medicine | Admitting: Internal Medicine

## 2019-11-22 DIAGNOSIS — R079 Chest pain, unspecified: Secondary | ICD-10-CM | POA: Diagnosis present

## 2019-11-22 MED ORDER — IOHEXOL 300 MG/ML  SOLN
75.0000 mL | Freq: Once | INTRAMUSCULAR | Status: AC | PRN
  Administered 2019-11-22: 08:00:00 75 mL via INTRAVENOUS

## 2020-04-18 ENCOUNTER — Emergency Department: Payer: Self-pay

## 2020-04-18 ENCOUNTER — Emergency Department
Admission: EM | Admit: 2020-04-18 | Discharge: 2020-04-18 | Disposition: A | Discharge status: Home / Self Care | Payer: Self-pay | Attending: Emergency Medicine | Admitting: Emergency Medicine

## 2020-04-18 DIAGNOSIS — R079 Chest pain, unspecified: Secondary | ICD-10-CM | POA: Insufficient documentation

## 2020-04-18 DIAGNOSIS — M549 Dorsalgia, unspecified: Secondary | ICD-10-CM | POA: Insufficient documentation

## 2020-04-18 DIAGNOSIS — Z5321 Procedure and treatment not carried out due to patient leaving prior to being seen by health care provider: Secondary | ICD-10-CM | POA: Insufficient documentation

## 2020-04-18 LAB — BASIC METABOLIC PANEL
Anion gap: 10 (ref 5–15)
BUN: 6 mg/dL (ref 6–20)
CO2: 27 mmol/L (ref 22–32)
Calcium: 8.3 mg/dL — ABNORMAL LOW (ref 8.9–10.3)
Chloride: 102 mmol/L (ref 98–111)
Creatinine, Ser: 0.59 mg/dL (ref 0.44–1.00)
GFR calc Af Amer: >60 mL/min (ref >60)
GFR calc non Af Amer: >60 mL/min (ref >60)
Glucose, Bld: 130 mg/dL — ABNORMAL HIGH (ref 70–99)
Potassium: 3.7 mmol/L (ref 3.5–5.1)
Sodium: 139 mmol/L (ref 135–145)

## 2020-04-18 LAB — CBC
HCT: 43.9 % (ref 36.0–46.0)
Hemoglobin: 15.3 g/dL — ABNORMAL HIGH (ref 12.0–15.0)
MCH: 36.1 pg — ABNORMAL HIGH (ref 26.0–34.0)
MCHC: 34.9 g/dL (ref 30.0–36.0)
MCV: 103.5 fL — ABNORMAL HIGH (ref 80.0–100.0)
Platelets: 219 10*3/uL (ref 150–400)
RBC: 4.24 MIL/uL (ref 3.87–5.11)
RDW: 14.8 % (ref 11.5–15.5)
WBC: 11.9 10*3/uL — ABNORMAL HIGH (ref 4.0–10.5)
nRBC: 0 % (ref 0.0–0.2)

## 2020-04-18 LAB — TROPONIN I (HIGH SENSITIVITY): Troponin I (High Sensitivity): 4 ng/L (ref <18)

## 2020-04-18 NOTE — ED Triage Notes (Signed)
Pt called for X-ray x 1.

## 2020-04-18 NOTE — ED Notes (Signed)
Pt called for repeat trop, no answer x 3.

## 2020-04-18 NOTE — ED Notes (Signed)
Pt called for repeat VS x 1.  

## 2020-04-18 NOTE — ED Triage Notes (Signed)
Patient woke up this morning with chest pain. Points to below the left breast and states the pain goes through to her back. Patient denies shortness of breath but states she used her inhaler "but not because I was short of breath."  Patient states had some dry heaves and that lasted on the way here and in the waiting room.

## 2020-04-18 NOTE — ED Notes (Signed)
Pt called for repeat VS x 2.  

## 2020-08-14 ENCOUNTER — Ambulatory Visit
Admission: RE | Admit: 2020-08-14 | Discharge: 2020-08-14 | Disposition: A | Discharge status: Home / Self Care | Payer: Worker's Compensation | Attending: Physician Assistant | Admitting: Physician Assistant

## 2020-08-14 ENCOUNTER — Other Ambulatory Visit: Payer: Self-pay | Admitting: Physician Assistant

## 2020-08-14 ENCOUNTER — Ambulatory Visit
Admission: RE | Admit: 2020-08-14 | Discharge: 2020-08-14 | Disposition: A | Discharge status: Home / Self Care | Payer: Worker's Compensation | Source: Ambulatory Visit | Attending: Physician Assistant | Admitting: Physician Assistant

## 2020-08-14 DIAGNOSIS — S6991XA Unspecified injury of right wrist, hand and finger(s), initial encounter: Secondary | ICD-10-CM

## 2020-10-21 ENCOUNTER — Ambulatory Visit
Admission: RE | Admit: 2020-10-21 | Discharge: 2020-10-21 | Disposition: A | Discharge status: Home / Self Care | Payer: Worker's Compensation | Source: Ambulatory Visit | Attending: Physician Assistant | Admitting: Physician Assistant

## 2020-10-21 ENCOUNTER — Other Ambulatory Visit: Payer: Self-pay | Admitting: Physician Assistant

## 2020-10-21 DIAGNOSIS — M79644 Pain in right finger(s): Secondary | ICD-10-CM | POA: Diagnosis present

## 2020-10-21 DIAGNOSIS — M7989 Other specified soft tissue disorders: Secondary | ICD-10-CM | POA: Insufficient documentation

## 2020-10-21 DIAGNOSIS — Y990 Civilian activity done for income or pay: Secondary | ICD-10-CM | POA: Diagnosis not present

## 2020-10-21 DIAGNOSIS — W208XXA Other cause of strike by thrown, projected or falling object, initial encounter: Secondary | ICD-10-CM | POA: Insufficient documentation

## 2020-10-21 DIAGNOSIS — X58XXXA Exposure to other specified factors, initial encounter: Secondary | ICD-10-CM

## 2020-12-03 ENCOUNTER — Encounter: Payer: Self-pay | Admitting: Family Medicine

## 2020-12-03 ENCOUNTER — Ambulatory Visit
Admission: EM | Admit: 2020-12-03 | Discharge: 2020-12-03 | Disposition: A | Discharge status: Home / Self Care | Payer: PRIVATE HEALTH INSURANCE | Attending: Family Medicine | Admitting: Family Medicine

## 2020-12-03 ENCOUNTER — Other Ambulatory Visit: Payer: Self-pay

## 2020-12-03 DIAGNOSIS — L0291 Cutaneous abscess, unspecified: Secondary | ICD-10-CM

## 2020-12-03 MED ORDER — DOXYCYCLINE HYCLATE 100 MG PO CAPS: 100.0000 mg | ORAL_CAPSULE | Freq: Two times a day (BID) | ORAL | 0 refills | Status: DC

## 2020-12-03 NOTE — ED Provider Notes (Signed)
Ann Delacruz    CSN: [id number] Arrival date & time: 12/03/20  0844      History   Chief Complaint Chief Complaint  Patient presents with  . Adenopathy    HPI Ann Delacruz is a 45 y.o. female.   Patient is a 45 year old female presents today with abscess to left sided facial area.  This is been present and worsening over the past week.  No specific drainage from the area.  No fevers, chills.  Has not done anything to treat her symptoms.     Past Medical History:  Diagnosis Date  . Asthma     Patient Active Problem List   Diagnosis Date Noted  . Substance induced mood disorder (HCC) 03/01/2016  . Alcohol abuse 03/01/2016  . Suicidal ideation 03/01/2016    Past Surgical History:  Procedure Laterality Date  . arm surgery    . CESAREAN SECTION    . LEG SURGERY      OB History   No obstetric history on file.      Home Medications    Prior to Admission medications   Medication Sig Start Date End Date Taking? Authorizing Provider  doxycycline (VIBRAMYCIN) 100 MG capsule Take 1 capsule (100 mg total) by mouth 2 (two) times daily. 12/03/20  Yes Drue Camera A, NP  albuterol (PROVENTIL HFA;VENTOLIN HFA) 108 (90 BASE) MCG/ACT inhaler Inhale 2 puffs into the lungs every 6 (six) hours as needed for wheezing or shortness of breath.    [provider]  Aspirin-Caffeine (BC FAST PAIN RELIEF PO) Take 1 Package by mouth daily as needed (pain).    [provider]    Family History History reviewed. No pertinent family history.  Social History Social History   Tobacco Use  . Smoking status: Current Every Day Smoker    Packs/day: 1.00    Types: Cigarettes  . Smokeless tobacco: Never Used  Substance Use Topics  . Alcohol use: Yes    Comment: occasionally  . Drug use: No     Allergies   Penicillins   Review of Systems Review of Systems   Physical Exam Triage Vital Signs ED Triage Vitals [12/03/20 0901]  Enc Vitals Group      BP (!) 161/102     Pulse Rate 90     Resp 16     Temp 98.8 F (37.1 C)     Temp Source Oral     SpO2 95 %     Weight 203 lb (92.1 kg)     Height 5\' 6"  (1.676 m)     Head Circumference      Peak Flow      Pain Score 4     Pain Loc      Pain Edu?      Excl. in GC?    No data found.  Updated Vital Signs BP (!) 161/102   Pulse 90   Temp 98.8 F (37.1 C) (Oral)   Resp 16   Ht 5\' 6"  (1.676 m)   Wt 203 lb (92.1 kg)   SpO2 95%   BMI 32.77 kg/m   Visual Acuity Right Eye Distance:   Left Eye Distance:   Bilateral Distance:    Right Eye Near:   Left Eye Near:    Bilateral Near:     Physical Exam Vitals and nursing note reviewed.  Constitutional:      General: She is not in acute distress.    Appearance: Normal appearance. She is not ill-appearing,  toxic-appearing or diaphoretic.  HENT:     Head: Normocephalic.      Comments: Left face- Abscess with erythema and induration. TTP. No drainage.   Right face- swelling, non tender. Pt reports this has been there for 2 months.  Right external ear mildly swollen.     Right Ear: Tympanic membrane normal.     Left Ear: Tympanic membrane and ear canal normal.     Nose: Nose normal.     Mouth/Throat:     Pharynx: Oropharynx is clear.  Eyes:     Conjunctiva/sclera: Conjunctivae normal.  Pulmonary:     Effort: Pulmonary effort is normal.  Musculoskeletal:        General: Normal range of motion.     Cervical back: Normal range of motion.  Skin:    General: Skin is warm and dry.     Findings: No rash.  Neurological:     Mental Status: She is alert.  Psychiatric:        Mood and Affect: Mood normal.      UC Treatments / Results  Labs (all labs ordered are listed, but only abnormal results are displayed) Labs Reviewed - No data to display  EKG   Radiology No results found.  Procedures Procedures (including critical care time)  Medications Ordered in UC Medications - No data to display  Initial  Impression / Assessment and Plan / UC Course  I have reviewed the triage vital signs and the nursing notes.  Pertinent labs & imaging results that were available during my care of the patient were reviewed by me and considered in my medical decision making (see chart for details).     Abscess to left facial area.  Treating with warm compresses and doxycyline.  Close watch and follow up.    Right facial swelling- could be lymphadenopathy. Really unsure of cause.  This has been present x 2 months. Recommend see her PCP for follow up on this.  Final Clinical Impressions(s) / UC Diagnoses   Final diagnoses:  Abscess     Discharge Instructions     Take the antibiotics as prescribed Warm compresses.  Follow up with your doctor about the swelling to the right side of your face.      ED Prescriptions    Medication Sig Dispense Auth. Provider   doxycycline (VIBRAMYCIN) 100 MG capsule Take 1 capsule (100 mg total) by mouth 2 (two) times daily. 20 capsule Dahlia Byes A, NP     PDMP not reviewed this encounter.   Janace Aris, NP 12/03/20 1004

## 2020-12-03 NOTE — ED Triage Notes (Signed)
Pt reports having swollen lymph node on L side of neck x1 week. No other symptoms or concerns at this time.

## 2020-12-03 NOTE — Discharge Instructions (Signed)
Take the antibiotics as prescribed Warm compresses.  Follow up with your doctor about the swelling to the right side of your face.

## 2021-01-21 ENCOUNTER — Other Ambulatory Visit: Payer: Self-pay | Admitting: Otolaryngology

## 2021-01-21 DIAGNOSIS — K118 Other diseases of salivary glands: Secondary | ICD-10-CM

## 2021-02-01 ENCOUNTER — Ambulatory Visit: Admission: RE | Admit: 2021-02-01 | Payer: PRIVATE HEALTH INSURANCE | Source: Ambulatory Visit

## 2021-02-05 ENCOUNTER — Other Ambulatory Visit: Payer: Self-pay

## 2021-02-05 ENCOUNTER — Ambulatory Visit
Admission: RE | Admit: 2021-02-05 | Discharge: 2021-02-05 | Disposition: A | Discharge status: Home / Self Care | Payer: 59 | Source: Ambulatory Visit | Attending: Otolaryngology | Admitting: Otolaryngology

## 2021-02-05 DIAGNOSIS — K118 Other diseases of salivary glands: Secondary | ICD-10-CM | POA: Diagnosis present

## 2021-02-05 MED ORDER — GADOBUTROL 1 MMOL/ML IV SOLN
10.0000 mL | Freq: Once | INTRAVENOUS | Status: AC | PRN
  Administered 2021-02-05: 10 mL via INTRAVENOUS

## 2021-07-15 IMAGING — CR DG FINGER LITTLE 2+V*R*
1 series · 3 of 3 positions shown · non-contrast
Comparison: 08/14/2020

CLINICAL DATA: Fifth digit injury several months ago with
persistent pain and swelling, initial encounter

EXAM:
RIGHT LITTLE FINGER 2+V

[Series 1: dg finger little right · 0.14mm/px · 3 of 3 slices shown]
[im 1/3]
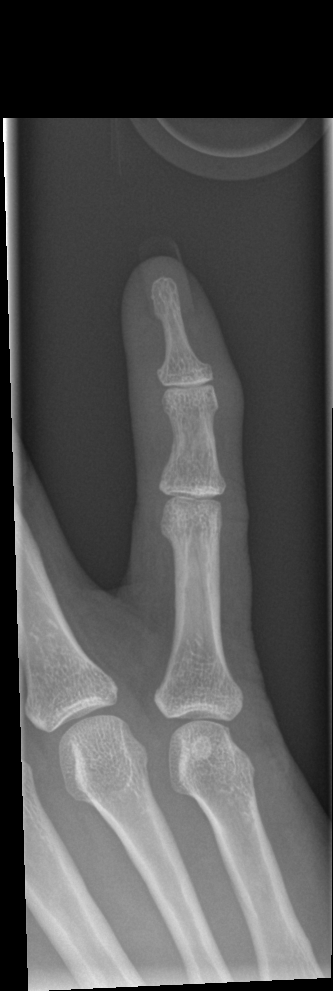
[im 2/3]
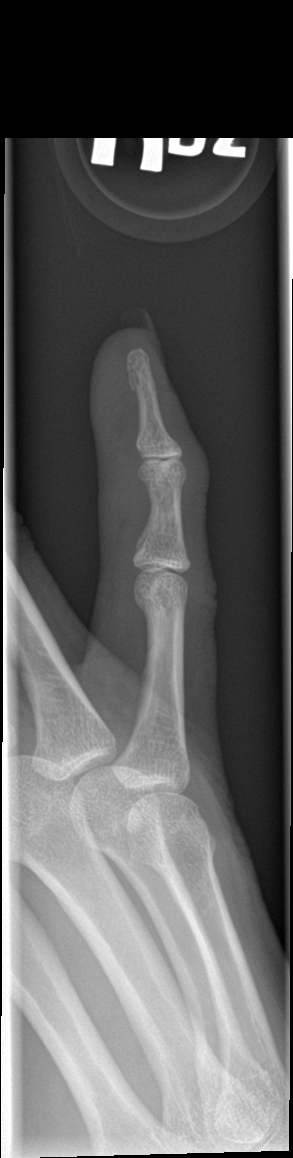
[im 3/3]
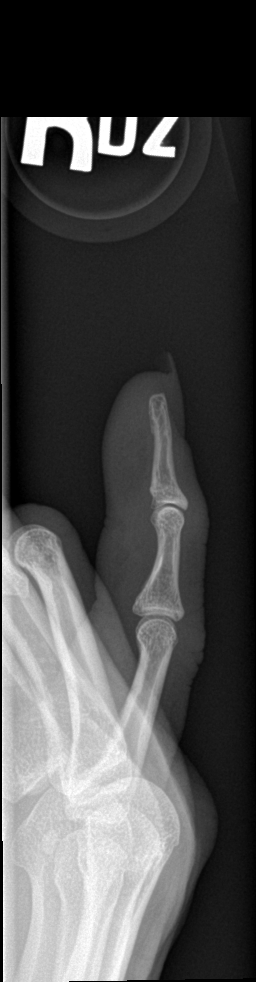

[3 of 3 positions shown; findings below may reference images not displayed]

FINDINGS: Soft tissue swelling is noted in the fifth digit. No definitive
fracture or dislocation is seen.
IMPRESSION: No acute abnormality noted. Persistent soft tissue swelling is seen.

## 2022-01-19 ENCOUNTER — Other Ambulatory Visit: Payer: Self-pay | Admitting: Student

## 2022-01-19 ENCOUNTER — Other Ambulatory Visit: Payer: Self-pay | Admitting: Internal Medicine

## 2022-01-19 DIAGNOSIS — Z1231 Encounter for screening mammogram for malignant neoplasm of breast: Secondary | ICD-10-CM

## 2022-09-12 ENCOUNTER — Ambulatory Visit
Admission: EM | Admit: 2022-09-12 | Discharge: 2022-09-12 | Disposition: A | Discharge status: Home / Self Care | Payer: 59 | Attending: Emergency Medicine | Admitting: Emergency Medicine

## 2022-09-12 ENCOUNTER — Encounter: Payer: Self-pay | Admitting: Emergency Medicine

## 2022-09-12 ENCOUNTER — Ambulatory Visit (INDEPENDENT_AMBULATORY_CARE_PROVIDER_SITE_OTHER): Payer: 59

## 2022-09-12 DIAGNOSIS — M79672 Pain in left foot: Secondary | ICD-10-CM | POA: Diagnosis not present

## 2022-09-12 DIAGNOSIS — S92355A Nondisplaced fracture of fifth metatarsal bone, left foot, initial encounter for closed fracture: Secondary | ICD-10-CM

## 2022-09-12 HISTORY — DX: Essential (primary) hypertension: I10

## 2022-09-12 MED ORDER — MELOXICAM 7.5 MG PO TABS: 7.5000 mg | ORAL_TABLET | Freq: Every day | ORAL | 0 refills | Status: DC

## 2022-09-12 MED ORDER — PREDNISONE 20 MG PO TABS: 40.0000 mg | ORAL_TABLET | Freq: Every day | ORAL | 0 refills | Status: DC

## 2022-09-12 NOTE — Discharge Instructions (Signed)
There is a break following the bone on your  fifth toe, confirmed on x-ray  You have been placed in a cam boot, please wear whenever standing and completing activity to add stability and support and keep fracture from worsening, may remove at rest  You may elevate leg when sitting, lying to help reduce swelling  Starting tomorrow take prednisone every morning with food for 5 days, may use Tylenol while using this medicine  Once steroid is complete you may use meloxicam every morning as needed to help reduce pain and swelling  Please schedule follow-up appointment with [facility] for further evaluation and management, these doctors will give you timeline of healing and if it there is further treatment that needs to be completed  You may ice affected area in 15 to 30-minute intervals as needed

## 2022-09-12 NOTE — ED Triage Notes (Signed)
Pt c/o left foot pain, swelling and bruising. Started about 3 days ago. She states she stepped wrong in the kitchen.

## 2022-09-12 NOTE — ED Provider Notes (Signed)
[facility]    CSN: [id number] Arrival date & time: 09/12/22  1854      History   Chief Complaint Chief Complaint  Patient presents with   Foot Pain    left    HPI Ann Delacruz is a 47 y.o. female.   Patient presents with left foot pain and swelling beginning 3 days ago after sidestepping in the kitchen, denies injury or trauma.  Painful to bear weight.  Numbness and tingling to all 5 digits.  Has attempted elevation, ice signs of improvement.  Past Medical History:  Diagnosis Date   Asthma    Hypertension     Patient Active Problem List   Diagnosis Date Noted   Substance induced mood disorder ([place]) 03/01/2016   Alcohol abuse 03/01/2016   Suicidal ideation 03/01/2016    Past Surgical History:  Procedure Laterality Date   arm surgery     CESAREAN SECTION     LEG SURGERY      OB History   No obstetric history on file.      Home Medications    Prior to Admission medications   Medication Sig Start Date End Date Taking? Authorizing Provider  meloxicam (MOBIC) 7.5 MG tablet Take 1 tablet (7.5 mg total) by mouth daily. 09/12/22  Yes Kacee Sukhu R, NP  predniSONE (DELTASONE) 20 MG tablet Take 2 tablets (40 mg total) by mouth daily. 09/12/22  Yes Jamise Pentland R, NP  albuterol (PROVENTIL HFA;VENTOLIN HFA) 108 (90 BASE) MCG/ACT inhaler Inhale 2 puffs into the lungs every 6 (six) hours as needed for wheezing or shortness of breath.    [provider]  Aspirin-Caffeine (BC FAST PAIN RELIEF PO) Take 1 Package by mouth daily as needed (pain).    [provider]  Aspirin-Caffeine 1000-65 MG PACK Take by mouth.    [provider]  doxycycline (VIBRAMYCIN) 100 MG capsule Take 1 capsule (100 mg total) by mouth 2 (two) times daily. 12/03/20   Loura Halt A, NP  hydrochlorothiazide (HYDRODIURIL) 25 MG tablet Take 25 mg by mouth daily.    [provider]  olmesartan (BENICAR) 5 MG tablet Take 5 mg by mouth daily.    [provider]    Family History No family history on file.  Social History Social History   Tobacco Use   Smoking status: Every Day    Packs/day: 1.00    Types: Cigarettes   Smokeless tobacco: Never  Vaping Use   Vaping Use: Never used  Substance Use Topics   Alcohol use: Yes    Comment: occasionally   Drug use: No     Allergies   Penicillins   Review of Systems Review of Systems Defer to HPI    Physical Exam Triage Vital Signs ED Triage Vitals  Enc Vitals Group     BP 09/12/22 1926 (!) 145/85     Pulse Rate 09/12/22 1926 84     Resp 09/12/22 1926 16     Temp 09/12/22 1926 98.2 F (36.8 C)     Temp Source 09/12/22 1926 Oral     SpO2 09/12/22 1926 96 %     Weight 09/12/22 1922 203 lb 0.7 oz (92.1 kg)     Height 09/12/22 1922 5\' 6"  (1.676 m)     Head Circumference --      Peak Flow --      Pain Score 09/12/22 1922 6     Pain Loc --      Pain Edu? --  Excl. in GC? --    No data found.  Updated Vital Signs BP (!) 145/85 (BP Location: Left Arm)   Pulse 84   Temp 98.2 F (36.8 C) (Oral)   Resp 16   Ht 5\' 6"  (1.676 m)   Wt 203 lb 0.7 oz (92.1 kg)   SpO2 96%   BMI 32.77 kg/m   Visual Acuity Right Eye Distance:   Left Eye Distance:   Bilateral Distance:    Right Eye Near:   Left Eye Near:    Bilateral Near:     Physical Exam Constitutional:      Appearance: Normal appearance.  Eyes:     Extraocular Movements: Extraocular movements intact.  Pulmonary:     Effort: Pulmonary effort is normal.  Musculoskeletal:     Comments: Tenderness and ecchymosis and mild to moderate swelling present over the dorsum aspect of the midfoot and along the lateral aspect of the midfoot without point tenderness, able to bear weight but pain elicited, range of motion of all 5 digits intact, sensation intact, cap refill less than 3  Neurological:     Mental Status: She is alert and oriented to person, place, and time. Mental status is at baseline.      UC  Treatments / Results  Labs (all labs ordered are listed, but only abnormal results are displayed) Labs Reviewed - No data to display  EKG   Radiology DG Foot Complete Left  Result Date: 09/12/2022 CLINICAL DATA:  Stepped wrong 4 days ago and injured left foot. EXAM: LEFT FOOT - COMPLETE 3+ VIEW COMPARISON:  Left ankle radiographs 09/23/2016, left foot radiographs 11/13/2014 FINDINGS: There is normal bone mineralization there is an oblique fracture of the mid to distal shaft of the fifth metatarsal with up to 2 mm medial and 1 mm dorsal displacement of the distal fracture component with respect to the proximal fracture component. Joint spaces are preserved. Unchanged small curvilinear ossicle just dorsal to the talar neck, chronic. No dislocation. IMPRESSION: Minimally displaced acute oblique fracture of the mid to distal shaft of the fifth metatarsal. Electronically Signed   By: Yvonne Kendall M.D.   On: 09/12/2022 19:29    Procedures Procedures (including critical care time)  Medications Ordered in UC Medications - No data to display  Initial Impression / Assessment and Plan / UC Course  I have reviewed the triage vital signs and the nursing notes.  Pertinent labs & imaging results that were available during my care of the patient were reviewed by me and considered in my medical decision making (see chart for details).   Closed nondisplaced fracture of fifth metatarsal bone of left foot, initial encounter  Fracture confirmed via x-ray, discussed with patient, cam boot applied by nursing staff, neurovascularly intact prior to and after placement, advised to wear during all activity, prescribed prednisone and meloxicam for management of swelling and pain, may use Tylenol additionally, recommended ice and elevation and follow-up with orthopedics in 1 to 2 weeks for reevaluation Final Clinical Impressions(s) / UC Diagnoses   Final diagnoses:  Closed nondisplaced fracture of fifth metatarsal  bone of left foot, initial encounter     Discharge Instructions      There is a break following the bone on your  fifth toe, confirmed on x-ray  You have been placed in a cam boot, please wear whenever standing and completing activity to add stability and support and keep fracture from worsening, may remove at rest  You may elevate leg  when sitting, lying to help reduce swelling  Starting tomorrow take prednisone every morning with food for 5 days, may use Tylenol while using this medicine  Once steroid is complete you may use meloxicam every morning as needed to help reduce pain and swelling  Please schedule follow-up appointment with [facility] for further evaluation and management, these doctors will give you timeline of healing and if it there is further treatment that needs to be completed  You may ice affected area in 15 to 30-minute intervals as needed   ED Prescriptions     Medication Sig Dispense Auth. Provider   predniSONE (DELTASONE) 20 MG tablet Take 2 tablets (40 mg total) by mouth daily. 10 tablet Jimena Wieczorek, Hansel Starling R, NP   meloxicam (MOBIC) 7.5 MG tablet Take 1 tablet (7.5 mg total) by mouth daily. 30 tablet Valinda Hoar, NP      PDMP not reviewed this encounter.   Valinda Hoar, NP 09/12/22 1943

## 2023-01-26 ENCOUNTER — Emergency Department (HOSPITAL_COMMUNITY): Payer: Self-pay

## 2023-01-26 ENCOUNTER — Observation Stay (HOSPITAL_COMMUNITY)
Admission: EM | Admit: 2023-01-26 | Discharge: 2023-01-27 | Disposition: A | Discharge status: Home / Self Care | Payer: Self-pay | Attending: Internal Medicine | Admitting: Internal Medicine

## 2023-01-26 ENCOUNTER — Other Ambulatory Visit: Payer: Self-pay

## 2023-01-26 ENCOUNTER — Encounter (HOSPITAL_COMMUNITY): Payer: Self-pay | Admitting: *Deleted

## 2023-01-26 DIAGNOSIS — I10 Essential (primary) hypertension: Secondary | ICD-10-CM | POA: Insufficient documentation

## 2023-01-26 DIAGNOSIS — J45909 Unspecified asthma, uncomplicated: Secondary | ICD-10-CM | POA: Insufficient documentation

## 2023-01-26 DIAGNOSIS — Z7982 Long term (current) use of aspirin: Secondary | ICD-10-CM | POA: Insufficient documentation

## 2023-01-26 DIAGNOSIS — Z720 Tobacco use: Secondary | ICD-10-CM | POA: Insufficient documentation

## 2023-01-26 DIAGNOSIS — Z8249 Family history of ischemic heart disease and other diseases of the circulatory system: Secondary | ICD-10-CM

## 2023-01-26 DIAGNOSIS — Z79899 Other long term (current) drug therapy: Secondary | ICD-10-CM | POA: Insufficient documentation

## 2023-01-26 DIAGNOSIS — I214 Non-ST elevation (NSTEMI) myocardial infarction: Secondary | ICD-10-CM | POA: Diagnosis present

## 2023-01-26 DIAGNOSIS — F17210 Nicotine dependence, cigarettes, uncomplicated: Secondary | ICD-10-CM | POA: Insufficient documentation

## 2023-01-26 DIAGNOSIS — I2510 Atherosclerotic heart disease of native coronary artery without angina pectoris: Principal | ICD-10-CM | POA: Insufficient documentation

## 2023-01-26 DIAGNOSIS — E876 Hypokalemia: Secondary | ICD-10-CM | POA: Insufficient documentation

## 2023-01-26 DIAGNOSIS — E785 Hyperlipidemia, unspecified: Secondary | ICD-10-CM | POA: Insufficient documentation

## 2023-01-26 DIAGNOSIS — R079 Chest pain, unspecified: Secondary | ICD-10-CM

## 2023-01-26 LAB — BASIC METABOLIC PANEL
Anion gap: 10 (ref 5–15)
BUN: 12 mg/dL (ref 6–20)
CO2: 31 mmol/L (ref 22–32)
Calcium: 8.9 mg/dL (ref 8.9–10.3)
Chloride: 96 mmol/L — ABNORMAL LOW (ref 98–111)
Creatinine, Ser: 0.69 mg/dL (ref 0.44–1.00)
GFR, Estimated: >60 mL/min (ref >60)
Glucose, Bld: 112 mg/dL — ABNORMAL HIGH (ref 70–99)
Potassium: 2.7 mmol/L — CL (ref 3.5–5.1)
Sodium: 137 mmol/L (ref 135–145)

## 2023-01-26 LAB — HEPATIC FUNCTION PANEL
ALT: 39 U/L (ref 0–44)
AST: 35 U/L (ref 15–41)
Albumin: 3.6 g/dL (ref 3.5–5.0)
Alkaline Phosphatase: 51 U/L (ref 38–126)
Bilirubin, Direct: <0.1 mg/dL (ref 0.0–0.2)
Total Bilirubin: 0.6 mg/dL (ref 0.3–1.2)
Total Protein: 6.9 g/dL (ref 6.5–8.1)

## 2023-01-26 LAB — CBC
HCT: 44.5 % (ref 36.0–46.0)
Hemoglobin: 14.8 g/dL (ref 12.0–15.0)
MCH: 33.7 pg (ref 26.0–34.0)
MCHC: 33.3 g/dL (ref 30.0–36.0)
MCV: 101.4 fL — ABNORMAL HIGH (ref 80.0–100.0)
Platelets: 278 10*3/uL (ref 150–400)
RBC: 4.39 MIL/uL (ref 3.87–5.11)
RDW: 14.1 % (ref 11.5–15.5)
WBC: 13.3 10*3/uL — ABNORMAL HIGH (ref 4.0–10.5)
nRBC: 0 % (ref 0.0–0.2)

## 2023-01-26 LAB — SALICYLATE LEVEL: Salicylate Lvl: 16 mg/dL (ref 7.0–30.0)

## 2023-01-26 LAB — TROPONIN I (HIGH SENSITIVITY)
Troponin I (High Sensitivity): 147 ng/L (ref <18)
Troponin I (High Sensitivity): 343 ng/L (ref <18)

## 2023-01-26 LAB — HEPARIN LEVEL (UNFRACTIONATED): Heparin Unfractionated: 0.13 IU/mL — ABNORMAL LOW (ref 0.30–0.70)

## 2023-01-26 MED ORDER — ASPIRIN 81 MG PO TBEC: 81.0000 mg | DELAYED_RELEASE_TABLET | Freq: Every day | ORAL | Status: DC

## 2023-01-26 MED ORDER — HEPARIN BOLUS VIA INFUSION
4000.0000 [IU] | Freq: Once | INTRAVENOUS | Status: AC
  Administered 2023-01-26: 4000 [IU] via INTRAVENOUS

## 2023-01-26 MED ORDER — GABAPENTIN 300 MG PO CAPS
300.0000 mg | ORAL_CAPSULE | Freq: Once | ORAL | Status: AC
  Administered 2023-01-26: 300 mg via ORAL
  Filled 2023-01-26: qty 1

## 2023-01-26 MED ORDER — HEPARIN (PORCINE) 25000 UT/250ML-% IV SOLN
1000.0000 [IU]/h | INTRAVENOUS | Status: DC
  Administered 2023-01-26: 1000 [IU]/h via INTRAVENOUS
  Filled 2023-01-26: qty 250

## 2023-01-26 MED ORDER — POTASSIUM CHLORIDE CRYS ER 20 MEQ PO TBCR
40.0000 meq | EXTENDED_RELEASE_TABLET | Freq: Once | ORAL | Status: AC
  Administered 2023-01-26: 40 meq via ORAL
  Filled 2023-01-26: qty 2

## 2023-01-26 MED ORDER — ASPIRIN 81 MG PO CHEW
81.0000 mg | CHEWABLE_TABLET | ORAL | Status: AC
  Administered 2023-01-27: 81 mg via ORAL
  Filled 2023-01-26: qty 1

## 2023-01-26 MED ORDER — ACETAMINOPHEN 325 MG PO TABS
650.0000 mg | ORAL_TABLET | ORAL | Status: DC | PRN
  Administered 2023-01-27: 650 mg via ORAL
  Filled 2023-01-26 (×2): qty 2

## 2023-01-26 MED ORDER — ONDANSETRON HCL 4 MG/2ML IJ SOLN: 4.0000 mg | Freq: Four times a day (QID) | INTRAMUSCULAR | Status: DC | PRN

## 2023-01-26 MED ORDER — LORAZEPAM 2 MG/ML IJ SOLN: 1.0000 mg | Freq: Once | INTRAMUSCULAR | Status: DC

## 2023-01-26 MED ORDER — FENTANYL CITRATE PF 50 MCG/ML IJ SOSY
25.0000 ug | PREFILLED_SYRINGE | Freq: Once | INTRAMUSCULAR | Status: AC
  Administered 2023-01-27: 25 ug via INTRAVENOUS
  Filled 2023-01-26: qty 1

## 2023-01-26 MED ORDER — PANTOPRAZOLE SODIUM 40 MG IV SOLR
40.0000 mg | Freq: Once | INTRAVENOUS | Status: AC
  Administered 2023-01-26: 40 mg via INTRAVENOUS
  Filled 2023-01-26: qty 10

## 2023-01-26 MED ORDER — POTASSIUM CHLORIDE 10 MEQ/100ML IV SOLN
10.0000 meq | Freq: Once | INTRAVENOUS | Status: AC
  Administered 2023-01-26: 10 meq via INTRAVENOUS
  Filled 2023-01-26: qty 100

## 2023-01-26 MED ORDER — SODIUM CHLORIDE 0.9 % WEIGHT BASED INFUSION
1.0000 mL/kg/h | INTRAVENOUS | Status: DC
  Administered 2023-01-27 (×2): 1 mL/kg/h via INTRAVENOUS

## 2023-01-26 MED ORDER — NITROGLYCERIN 0.4 MG SL SUBL: 0.4000 mg | SUBLINGUAL_TABLET | SUBLINGUAL | Status: DC | PRN

## 2023-01-26 MED ORDER — NITROGLYCERIN 0.4 MG SL SUBL: 0.4000 mg | SUBLINGUAL_TABLET | Freq: Once | SUBLINGUAL | Status: DC

## 2023-01-26 MED ORDER — SODIUM CHLORIDE 0.9% FLUSH
3.0000 mL | Freq: Two times a day (BID) | INTRAVENOUS | Status: DC
  Administered 2023-01-26: 3 mL via INTRAVENOUS

## 2023-01-26 MED ORDER — SODIUM CHLORIDE 0.9% FLUSH: 3.0000 mL | INTRAVENOUS | Status: DC | PRN

## 2023-01-26 MED ORDER — HYDROMORPHONE HCL 1 MG/ML IJ SOLN
1.0000 mg | Freq: Once | INTRAMUSCULAR | Status: AC
  Administered 2023-01-26: 1 mg via INTRAVENOUS
  Filled 2023-01-26: qty 1

## 2023-01-26 MED ORDER — SODIUM CHLORIDE 0.9 % WEIGHT BASED INFUSION
3.0000 mL/kg/h | INTRAVENOUS | Status: DC
  Administered 2023-01-27: 3 mL/kg/h via INTRAVENOUS

## 2023-01-26 MED ORDER — SODIUM CHLORIDE 0.9 % IV SOLN: 250.0000 mL | INTRAVENOUS | Status: DC | PRN

## 2023-01-26 MED ORDER — ROSUVASTATIN CALCIUM 20 MG PO TABS
40.0000 mg | ORAL_TABLET | Freq: Every day | ORAL | Status: DC
  Administered 2023-01-26 – 2023-01-27 (×2): 40 mg via ORAL
  Filled 2023-01-26 (×2): qty 2

## 2023-01-26 MED ORDER — NICOTINE 21 MG/24HR TD PT24
21.0000 mg | MEDICATED_PATCH | Freq: Once | TRANSDERMAL | Status: AC
  Administered 2023-01-26: 21 mg via TRANSDERMAL
  Filled 2023-01-26: qty 1

## 2023-01-26 MED ORDER — LORAZEPAM 2 MG/ML IJ SOLN
0.5000 mg | Freq: Once | INTRAMUSCULAR | Status: AC
  Administered 2023-01-26: 0.5 mg via INTRAVENOUS
  Filled 2023-01-26: qty 1

## 2023-01-26 MED ORDER — IOHEXOL 350 MG/ML SOLN
100.0000 mL | Freq: Once | INTRAVENOUS | Status: AC | PRN
  Administered 2023-01-26: 100 mL via INTRAVENOUS

## 2023-01-26 NOTE — ED Provider Notes (Signed)
[place] [facility] DEPARTMENT AT [facility] Provider Note   CSN: [id number] Arrival date & time: 01/26/23  1157     History  Chief Complaint  Patient presents with   Chest Pain    Ann Delacruz is a 47 y.o. female.  Patient states around 1115 she started having pain in her chest and her back.  Patient has a history of hypertension and smoking  The history is provided by the patient and medical records. No language interpreter was used.  Chest Pain Pain location:  L chest Pain quality: aching   Pain radiates to:  Does not radiate Pain severity:  Moderate Onset quality:  Sudden Timing:  Constant Progression:  Worsening Chronicity:  New Relieved by:  Nothing Worsened by:  Nothing Associated symptoms: no abdominal pain, no back pain, no cough, no fatigue and no headache        Home Medications Prior to Admission medications   Medication Sig Start Date End Date Taking? Authorizing Provider  albuterol (PROVENTIL HFA;VENTOLIN HFA) 108 (90 BASE) MCG/ACT inhaler Inhale 2 puffs into the lungs every 6 (six) hours as needed for wheezing or shortness of breath.    [provider]  Aspirin-Caffeine (BC FAST PAIN RELIEF PO) Take 1 Package by mouth daily as needed (pain).    [provider]  Aspirin-Caffeine 1000-65 MG PACK Take by mouth.    [provider]  doxycycline (VIBRAMYCIN) 100 MG capsule Take 1 capsule (100 mg total) by mouth 2 (two) times daily. 12/03/20   Dahlia Byes A, NP  hydrochlorothiazide (HYDRODIURIL) 25 MG tablet Take 25 mg by mouth daily.    [provider]  meloxicam (MOBIC) 7.5 MG tablet Take 1 tablet (7.5 mg total) by mouth daily. 09/12/22   White, Elita Boone, NP  olmesartan (BENICAR) 5 MG tablet Take 5 mg by mouth daily.    [provider]  predniSONE (DELTASONE) 20 MG tablet Take 2 tablets (40 mg total) by mouth daily. 09/12/22   Valinda Hoar, NP      Allergies    Penicillins    Review of  Systems   Review of Systems  Constitutional:  Negative for appetite change and fatigue.  HENT:  Negative for congestion, ear discharge and sinus pressure.   Eyes:  Negative for discharge.  Respiratory:  Negative for cough.   Cardiovascular:  Positive for chest pain.  Gastrointestinal:  Negative for abdominal pain and diarrhea.  Genitourinary:  Negative for frequency and hematuria.  Musculoskeletal:  Negative for back pain.  Skin:  Negative for rash.  Neurological:  Negative for seizures and headaches.  Psychiatric/Behavioral:  Negative for hallucinations.     Physical Exam Updated Vital Signs BP 97/63   Pulse 88   Temp 98.2 F (36.8 C) (Oral)   Resp 18   Ht 5\' 6"  (1.676 m)   Wt 85.7 kg   SpO2 95%   BMI 30.51 kg/m  Physical Exam Vitals and nursing note reviewed.  Constitutional:      Appearance: She is well-developed.  HENT:     Head: Normocephalic.     Nose: Nose normal.  Eyes:     General: No scleral icterus.    Conjunctiva/sclera: Conjunctivae normal.  Neck:     Thyroid: No thyromegaly.  Cardiovascular:     Rate and Rhythm: Normal rate and regular rhythm.     Heart sounds: No murmur heard.    No friction rub. No gallop.  Pulmonary:     Breath sounds: No  stridor. No wheezing or rales.  Chest:     Chest wall: No tenderness.  Abdominal:     General: There is no distension.     Tenderness: There is no abdominal tenderness. There is no rebound.  Musculoskeletal:        General: Normal range of motion.     Cervical back: Neck supple.  Lymphadenopathy:     Cervical: No cervical adenopathy.  Skin:    Findings: No erythema or rash.  Neurological:     Mental Status: She is alert and oriented to person, place, and time.     Motor: No abnormal muscle tone.     Coordination: Coordination normal.  Psychiatric:        Behavior: Behavior normal.     ED Results / Procedures / Treatments   Labs (all labs ordered are listed, but only abnormal results are  displayed) Labs Reviewed  BASIC METABOLIC PANEL - Abnormal; Notable for the following components:      Result Value   Potassium 2.7 (*)    Chloride 96 (*)    Glucose, Bld 112 (*)    All other components within normal limits  CBC - Abnormal; Notable for the following components:   WBC 13.3 (*)    MCV 101.4 (*)    All other components within normal limits  TROPONIN I (HIGH SENSITIVITY) - Abnormal; Notable for the following components:   Troponin I (High Sensitivity) 147 (*)    All other components within normal limits  TROPONIN I (HIGH SENSITIVITY) - Abnormal; Notable for the following components:   Troponin I (High Sensitivity) 343 (*)    All other components within normal limits  HEPATIC FUNCTION PANEL  SALICYLATE LEVEL    EKG None  Radiology CT Angio Chest/Abd/Pel for Dissection W and/or Wo Contrast  Result Date: 01/26/2023 CLINICAL DATA:  Acute aortic syndrome (AAS) suspected EXAM: CT ANGIOGRAPHY CHEST, ABDOMEN AND PELVIS TECHNIQUE: Non-contrast CT of the chest was initially obtained. Multidetector CT imaging through the chest, abdomen and pelvis was performed using the standard protocol during bolus administration of intravenous contrast. Multiplanar reconstructed images and MIPs were obtained and reviewed to evaluate the vascular anatomy. RADIATION DOSE REDUCTION: This exam was performed according to the departmental dose-optimization program which includes automated exposure control, adjustment of the mA and/or kV according to patient size and/or use of iterative reconstruction technique. CONTRAST:  OMNIPAQUE IOHEXOL 350 MG/ML SOLN COMPARISON:  November 22, 2019. FINDINGS: CTA CHEST FINDINGS Cardiovascular: Preferential opacification of the thoracic aorta. No evidence of thoracic aortic aneurysm or dissection. No evidence of intramural hematoma. Normal heart size. No pericardial effusion. Scattered atherosclerotic calcifications. Mediastinum/Nodes: No axillary or mediastinal  adenopathy. Visualized thyroid is unremarkable. Lungs/Pleura: No pleural effusion or pneumothorax. Unchanged 3 mm RIGHT lower lobe pulmonary nodule and 3 mm LEFT apical pulmonary nodule since 2021, consistent with a benign etiology (for which no dedicated imaging follow-up is recommended) (series 7, image 94, image 15). Musculoskeletal: No chest wall abnormality. No acute or significant osseous findings. Review of the MIP images confirms the above findings. CTA ABDOMEN AND PELVIS FINDINGS VASCULAR Aorta: Normal caliber aorta without aneurysm, dissection, vasculitis or significant stenosis. Celiac: Patent without evidence of aneurysm, dissection, vasculitis or significant stenosis. SMA: Patent without evidence of aneurysm, dissection, vasculitis or significant stenosis. Renals: Both renal arteries are patent without evidence of aneurysm, dissection, vasculitis, fibromuscular dysplasia or significant stenosis. IMA: Patent without evidence of aneurysm, dissection, vasculitis or significant stenosis. Inflow: Patent without evidence of aneurysm, dissection,  vasculitis or significant stenosis. Atherosclerotic calcifications. Veins: No obvious venous abnormality within the limitations of this arterial phase study. Review of the MIP images confirms the above findings. NON-VASCULAR Hepatobiliary: Gallbladder is unremarkable. No focal hepatic lesion. No intrahepatic or extrahepatic biliary ductal dilation. Pancreas: Unremarkable. No pancreatic ductal dilatation or surrounding inflammatory changes. Spleen: Normal in size without focal abnormality. Adrenals/Urinary Tract: Adrenal glands are unremarkable. Kidneys enhance symmetrically. No hydronephrosis. Bilateral nonobstructing nephrolithiasis. No obstructing nephrolithiasis. Bladder is unremarkable. Stomach/Bowel: No evidence of bowel obstruction. Appendix is normal. Stomach is unremarkable. Lymphatic: No suspicious lymphadenopathy. Reproductive: Uterus and bilateral adnexa  are unremarkable. Other: No free air or free fluid. Musculoskeletal: Degenerative changes of L5-S1. Review of the MIP images confirms the above findings. IMPRESSION: 1. No evidence of acute aortic syndrome. 2. No acute findings within the chest, abdomen or pelvis. 3. Age advanced atherosclerotic calcifications. Aortic Atherosclerosis (ICD10-I70.0). Electronically Signed   By: Meda Klinefelter M.D.   On: 01/26/2023 13:32   DG Chest Port 1 View  Result Date: 01/26/2023 CLINICAL DATA:  Chest pain EXAM: PORTABLE CHEST 1 VIEW COMPARISON:  CXR 05/05/14 FINDINGS: The heart size and mediastinal contours are within normal limits. Both lungs are clear. The visualized skeletal structures are unremarkable. IMPRESSION: No active disease. Electronically Signed   By: Lorenza Cambridge M.D.   On: 01/26/2023 13:01    Procedures Procedures    Medications Ordered in ED Medications  nitroGLYCERIN (NITROSTAT) SL tablet 0.4 mg (0 mg Sublingual Hold 01/26/23 1458)  pantoprazole (PROTONIX) injection 40 mg (40 mg Intravenous Given 01/26/23 1304)  HYDROmorphone (DILAUDID) injection 1 mg (1 mg Intravenous Given 01/26/23 1304)  LORazepam (ATIVAN) injection 0.5 mg (0.5 mg Intravenous Given 01/26/23 1304)  iohexol (OMNIPAQUE) 350 MG/ML injection 100 mL (100 mLs Intravenous Contrast Given 01/26/23 1251)  potassium chloride SA (KLOR-CON M) CR tablet 40 mEq (40 mEq Oral Given 01/26/23 1424)  potassium chloride 10 mEq in 100 mL IVPB (10 mEq Intravenous New Bag/Given 01/26/23 1424)    ED Course/ Medical Decision Making/ A&P  CRITICAL CARE Performed by: Bethann Berkshire Total critical care time: 45 minutes Critical care time was exclusive of separately billable procedures and treating other patients. Critical care was necessary to treat or prevent imminent or life-threatening deterioration. Critical care was time spent personally by me on the following activities: development of treatment plan with patient and/or surrogate as well as  nursing, discussions with consultants, evaluation of patient's response to treatment, examination of patient, obtaining history from patient or surrogate, ordering and performing treatments and interventions, ordering and review of laboratory studies, ordering and review of radiographic studies, pulse oximetry and re-evaluation of patient's condition.  Patient was seen by cardiology and it was determined she was having an NSTEMI.  She will be admitted by cardiology over to [facility]                           Medical Decision Making Amount and/or Complexity of Data Reviewed Labs: ordered. Radiology: ordered.  Risk Prescription drug management. Decision regarding hospitalization.  This patient presents to the ED for concern of chest pain, this involves an extensive number of treatment options, and is a complaint that carries with it a high risk of complications and morbidity.  The differential diagnosis includes PE, MI   Co morbidities that complicate the patient evaluation  Hypertension   Additional history obtained:  Additional history obtained from patient External records from outside source obtained and reviewed  including full records   Lab Tests:  I Ordered, and personally interpreted labs.  The pertinent results include: Troponin 340   Imaging Studies ordered:  I ordered imaging studies including chest x-ray I independently visualized and interpreted imaging which showed negative I agree with the radiologist interpretation   Cardiac Monitoring: / EKG:  The patient was maintained on a cardiac monitor.  I personally viewed and interpreted the cardiac monitored which showed an underlying rhythm of: Normal sinus rhythm   Consultations Obtained:  I requested consultation with the cardiology,  and discussed lab and imaging findings as well as pertinent plan - they recommend: Admit to [facility]   Problem List / ED Course / Critical interventions / Medication  management  Hypertension and nSTEMI I ordered medication including nitroglycerin for chest pain Reevaluation of the patient after these medicines showed that the patient improved I have reviewed the patients home medicines and have made adjustments as needed   Social Determinants of Health:  None   Test / Admission - Considered:  None  NSTEMI.  Patient being admitted to cardiology at [facility]        Final Clinical Impression(s) / ED Diagnoses Final diagnoses:  None    Rx / DC Orders ED Discharge Orders     None         Bethann Berkshire, MD 01/28/23 [phone]

## 2023-01-26 NOTE — Progress Notes (Signed)
ANTICOAGULATION CONSULT NOTE - Initial Consult  Pharmacy Consult for heparin Indication: chest pain/ACS  Allergies  Allergen Reactions   Penicillins     Childhood allergy    Patient Measurements: Height: 5\' 6"  (167.6 cm) Weight: 85.7 kg (189 lb) IBW/kg (Calculated) : 59.3 Heparin Dosing Weight: 77 kg  Vital Signs: Temp: 98.2 F (36.8 C) (05/16 1211) Temp Source: Oral (05/16 1211) BP: 117/81 (05/16 1530) Pulse Rate: 89 (05/16 1530)  Labs: Recent Labs    01/26/23 1310 01/26/23 1429  HGB 14.8  --   HCT 44.5  --   PLT 278  --   CREATININE 0.69  --   TROPONINIHS 147* 343*    Estimated Creatinine Clearance: 97 mL/min (by C-G formula based on SCr of 0.69 mg/dL).   Medical History: Past Medical History:  Diagnosis Date   Asthma    Hypertension     Medications:  (Not in a hospital admission)   Assessment: Pharmacy consulted to dose heparin in patient with chest pain/ACS.  Patient is not on anticoagulation prior to admission.  CBC WNL  Trop 343  Goal of Therapy:  Heparin level 0.3-0.7 units/ml Monitor platelets by anticoagulation protocol: Yes   Plan:  Give 4000 units bolus x 1 Start heparin infusion at 1000 units/hr Check anti-Xa level in 6 hours and daily while on heparin Continue to monitor H&H and platelets  Judeth Cornfield, PharmD Clinical Pharmacist 01/26/2023 3:42 PM

## 2023-01-26 NOTE — ED Triage Notes (Signed)
Pt BIB [facility] for CP tightness/heaviness while driving and mid right upper back pain/shoulder blade x 30-40 minutes. + nausea earlier, denies at present. Reported pt was hyperventilating   and had tingling to hands, denies at present.   Took 5 BC powders today for her chronic lower back pain per report.   HR 140/-150's with ems arrival. 129/80

## 2023-01-26 NOTE — H&P (Addendum)
CARDIOLOGY HISTORY AND PHYSICAL    Patient ID: Ann Delacruz; [id number]; 1976/05/29   Admit date: 01/26/2023 Date of Consult: 01/26/2023  Primary Care Provider: Enid Baas, MD Primary Cardiologist:  Primary Electrophysiologist:    History of Present Illness:   Ann Delacruz is a 47 year old F known to have HTN, nicotine abuse presented to ER with chest pain.  Patient was taking BC powders for the last 20 years to control pain due to her disc bulge and also migraines.  She started to experience sudden onset of tingling/pain in the back associated with pain in her chest radiating to her shoulder blade and nausea until she arrived to the ER.  Pain completely resolved with Dilaudid, Ativan and pantoprazole.  She reported having heart feeling starting from her chest radiating up to her face for the last 30 days.  She thought this could be hot flashes as she was approaching menopause. Denied any symptoms of DOE, dizziness, palpitations or leg swelling. She works 100 hours a week.  Has been smoking 2 packs/day since she was 47 years old.  She also had secondhand exposure to smoking while she was growing up.  Her mom was a heavy smoker, her mother will be receiving a PCI in 1 week at the age of 10 years.  EKG showed no ischemia High-sensitivity troponins were elevated, 147>>343  Past Medical History:  Diagnosis Date   Asthma    Hypertension     Past Surgical History:  Procedure Laterality Date   arm surgery     CESAREAN SECTION     LEG SURGERY     Inpatient Medications: Scheduled Meds:  heparin  4,000 Units Intravenous Once   nitroGLYCERIN  0.4 mg Sublingual Once   Continuous Infusions:  heparin     potassium chloride 10 mEq (01/26/23 1541)   PRN Meds:   Allergies:    Allergies  Allergen Reactions   Penicillins     Childhood allergy    Social History:   Social History   Socioeconomic History   Marital status: Divorced    Spouse name: Not on file    Number of children: Not on file   Years of education: Not on file   Highest education level: Not on file  Occupational History   Not on file  Tobacco Use   Smoking status: Every Day    Packs/day: 2    Types: Cigarettes   Smokeless tobacco: Never  Vaping Use   Vaping Use: Never used  Substance and Sexual Activity   Alcohol use: Yes    Comment: occasionally   Drug use: No   Sexual activity: Not on file  Other Topics Concern   Not on file  Social History Narrative   Not on file   Social Determinants of Health   Financial Resource Strain: Not on file  Food Insecurity: Not on file  Transportation Needs: Not on file  Physical Activity: Not on file  Stress: Not on file  Social Connections: Not on file  Intimate Partner Violence: Not on file    Family History:  History reviewed. No pertinent family history.   ROS:  Please see the history of present illness.  ROS  All other ROS reviewed and negative.     Physical Exam/Data:   Vitals:   01/26/23 1400 01/26/23 1430 01/26/23 1500 01/26/23 1530  BP: 125/70 102/67 97/63 117/81  Pulse: 93  88 89  Resp: (!) 21 10 18 12   Temp:      TempSrc:  SpO2: 97%  95% 99%  Weight:      Height:       No intake or output data in the 24 hours ending 01/26/23 1617 Filed Weights   01/26/23 1209  Weight: 85.7 kg   Body mass index is 30.51 kg/m.  General:  Well nourished, well developed, in no acute distress HEENT: normal Lymph: no adenopathy Neck: no JVD Endocrine:  No thryomegaly Vascular: No carotid bruits; FA pulses 2+ bilaterally without bruits  Cardiac:  normal S1, S2; RRR; no murmur  Lungs:  clear to auscultation bilaterally, no wheezing, rhonchi or rales  Abd: soft, nontender, no hepatomegaly  Ext: no edema Musculoskeletal:  No deformities, BUE and BLE strength normal and equal Skin: warm and dry  Neuro:  CNs 2-12 intact, no focal abnormalities noted Psych:  Normal affect   EKG:  The EKG was personally reviewed and  demonstrates: NSR no ischemia Telemetry:  Telemetry was personally reviewed and demonstrates: NSR   Laboratory Data:  Chemistry Recent Labs  Lab 01/26/23 1310  NA 137  K 2.7*  CL 96*  CO2 31  GLUCOSE 112*  BUN 12  CREATININE 0.69  CALCIUM 8.9  GFRNONAA >60  ANIONGAP 10    Recent Labs  Lab 01/26/23 1310  PROT 6.9  ALBUMIN 3.6  AST 35  ALT 39  ALKPHOS 51  BILITOT 0.6   Hematology Recent Labs  Lab 01/26/23 1310  WBC 13.3*  RBC 4.39  HGB 14.8  HCT 44.5  MCV 101.4*  MCH 33.7  MCHC 33.3  RDW 14.1  PLT 278   Cardiac EnzymesNo results for input(s): "TROPONINI" in the last 168 hours. No results for input(s): "TROPIPOC" in the last 168 hours.  BNPNo results for input(s): "BNP", "PROBNP" in the last 168 hours.  DDimer No results for input(s): "DDIMER" in the last 168 hours.  Radiology/Studies:  CT Angio Chest/Abd/Pel for Dissection W and/or Wo Contrast  Result Date: 01/26/2023 CLINICAL DATA:  Acute aortic syndrome (AAS) suspected EXAM: CT ANGIOGRAPHY CHEST, ABDOMEN AND PELVIS TECHNIQUE: Non-contrast CT of the chest was initially obtained. Multidetector CT imaging through the chest, abdomen and pelvis was performed using the standard protocol during bolus administration of intravenous contrast. Multiplanar reconstructed images and MIPs were obtained and reviewed to evaluate the vascular anatomy. RADIATION DOSE REDUCTION: This exam was performed according to the departmental dose-optimization program which includes automated exposure control, adjustment of the mA and/or kV according to patient size and/or use of iterative reconstruction technique. CONTRAST:  OMNIPAQUE IOHEXOL 350 MG/ML SOLN COMPARISON:  November 22, 2019. FINDINGS: CTA CHEST FINDINGS Cardiovascular: Preferential opacification of the thoracic aorta. No evidence of thoracic aortic aneurysm or dissection. No evidence of intramural hematoma. Normal heart size. No pericardial effusion. Scattered atherosclerotic  calcifications. Mediastinum/Nodes: No axillary or mediastinal adenopathy. Visualized thyroid is unremarkable. Lungs/Pleura: No pleural effusion or pneumothorax. Unchanged 3 mm RIGHT lower lobe pulmonary nodule and 3 mm LEFT apical pulmonary nodule since 2021, consistent with a benign etiology (for which no dedicated imaging follow-up is recommended) (series 7, image 94, image 15). Musculoskeletal: No chest wall abnormality. No acute or significant osseous findings. Review of the MIP images confirms the above findings. CTA ABDOMEN AND PELVIS FINDINGS VASCULAR Aorta: Normal caliber aorta without aneurysm, dissection, vasculitis or significant stenosis. Celiac: Patent without evidence of aneurysm, dissection, vasculitis or significant stenosis. SMA: Patent without evidence of aneurysm, dissection, vasculitis or significant stenosis. Renals: Both renal arteries are patent without evidence of aneurysm, dissection, vasculitis, fibromuscular dysplasia or  significant stenosis. IMA: Patent without evidence of aneurysm, dissection, vasculitis or significant stenosis. Inflow: Patent without evidence of aneurysm, dissection, vasculitis or significant stenosis. Atherosclerotic calcifications. Veins: No obvious venous abnormality within the limitations of this arterial phase study. Review of the MIP images confirms the above findings. NON-VASCULAR Hepatobiliary: Gallbladder is unremarkable. No focal hepatic lesion. No intrahepatic or extrahepatic biliary ductal dilation. Pancreas: Unremarkable. No pancreatic ductal dilatation or surrounding inflammatory changes. Spleen: Normal in size without focal abnormality. Adrenals/Urinary Tract: Adrenal glands are unremarkable. Kidneys enhance symmetrically. No hydronephrosis. Bilateral nonobstructing nephrolithiasis. No obstructing nephrolithiasis. Bladder is unremarkable. Stomach/Bowel: No evidence of bowel obstruction. Appendix is normal. Stomach is unremarkable. Lymphatic: No suspicious  lymphadenopathy. Reproductive: Uterus and bilateral adnexa are unremarkable. Other: No free air or free fluid. Musculoskeletal: Degenerative changes of L5-S1. Review of the MIP images confirms the above findings. IMPRESSION: 1. No evidence of acute aortic syndrome. 2. No acute findings within the chest, abdomen or pelvis. 3. Age advanced atherosclerotic calcifications. Aortic Atherosclerosis (ICD10-I70.0). Electronically Signed   By: Meda Klinefelter M.D.   On: 01/26/2023 13:32   DG Chest Port 1 View  Result Date: 01/26/2023 CLINICAL DATA:  Chest pain EXAM: PORTABLE CHEST 1 VIEW COMPARISON:  CXR 05/05/14 FINDINGS: The heart size and mediastinal contours are within normal limits. Both lungs are clear. The visualized skeletal structures are unremarkable. IMPRESSION: No active disease. Electronically Signed   By: Lorenza Cambridge M.D.   On: 01/26/2023 13:01    Assessment and Plan:   Patient is a 47 year old F known to have HTN, nicotine abuse presented to ER with chest pain.  EKG showed no ischemia.  High sensitive troponins 147>>343.  Chest pain-free upon arrival to the ER after administering Dilaudid, Ativan and pantoprazole.  # NSTEMI -Patient had chest pain radiating to the shoulder blades x 1 hour, until she arrived to the ER. Chest pain-free upon arrival after receiving Dilaudid, Ativan and pantoprazole. EKG showed no ischemia. High sensitivity troponins 147>>343.  She will benefit from transfer to [facility] for [facility] due to NSTEMI and she has risk factors of HTN, nicotine abuse (2 packs/day since she was 47 years old), secondhand exposure to smoking while growing up and family history of premature CAD (mother will get PCI in 1 week at the age of 80 years). Risks and benefits of cardiac catheterization have been discussed with the patient.  These include bleeding, infection, kidney damage, stroke, heart attack, death.  The patient understands these risks and is willing to proceed. -Start ACS  protocol -Obtain HbA1c, lipid panel and lipoprotein a -Obtain 2D echocardiogram  # Severe hypokalemia K 2.7 -Aggressively replete serum potassium. Keep K>4 and <5.  # Nicotine abuse -Smoking cessation counseling provided.  Currently smokes 2 packs/day since she was 47 years old.  # HTN, controlled -Blood pressure soft in the ER now.  Will hold home antihypertensive medications.  I have spent a total of 75 minutes with patient reviewing chart , telemetry, EKGs, labs and examining patient as well as establishing an assessment and plan that was discussed with the patient.  > 50% of time was spent in direct patient care.      For questions or updates, please contact [facility] Please consult [email or web address] for contact info under Cardiology/STEMI.   Signed, Herbert Deaner, MD 01/26/2023 4:17 PM

## 2023-01-27 ENCOUNTER — Other Ambulatory Visit (HOSPITAL_COMMUNITY): Payer: Self-pay

## 2023-01-27 ENCOUNTER — Telehealth: Payer: Self-pay | Admitting: Cardiology

## 2023-01-27 ENCOUNTER — Observation Stay (HOSPITAL_BASED_OUTPATIENT_CLINIC_OR_DEPARTMENT_OTHER): Payer: Self-pay

## 2023-01-27 ENCOUNTER — Other Ambulatory Visit: Payer: Self-pay | Admitting: Cardiology

## 2023-01-27 ENCOUNTER — Encounter (HOSPITAL_COMMUNITY)
Admission: EM | Disposition: A | Discharge status: Home / Self Care | Payer: Self-pay | Source: Home / Self Care | Attending: Emergency Medicine

## 2023-01-27 DIAGNOSIS — E785 Hyperlipidemia, unspecified: Secondary | ICD-10-CM | POA: Insufficient documentation

## 2023-01-27 DIAGNOSIS — I214 Non-ST elevation (NSTEMI) myocardial infarction: Secondary | ICD-10-CM

## 2023-01-27 DIAGNOSIS — I2510 Atherosclerotic heart disease of native coronary artery without angina pectoris: Secondary | ICD-10-CM

## 2023-01-27 DIAGNOSIS — I10 Essential (primary) hypertension: Secondary | ICD-10-CM | POA: Insufficient documentation

## 2023-01-27 DIAGNOSIS — Z720 Tobacco use: Secondary | ICD-10-CM | POA: Insufficient documentation

## 2023-01-27 DIAGNOSIS — E876 Hypokalemia: Secondary | ICD-10-CM

## 2023-01-27 DIAGNOSIS — R079 Chest pain, unspecified: Secondary | ICD-10-CM

## 2023-01-27 HISTORY — PX: LEFT HEART CATH AND CORONARY ANGIOGRAPHY: CATH118249

## 2023-01-27 LAB — LIPID PANEL
Cholesterol: 223 mg/dL — ABNORMAL HIGH (ref 0–200)
HDL: 45 mg/dL (ref >40)
LDL Cholesterol: 144 mg/dL — ABNORMAL HIGH (ref 0–99)
Total CHOL/HDL Ratio: 5 RATIO
Triglycerides: 172 mg/dL — ABNORMAL HIGH (ref <150)
VLDL: 34 mg/dL (ref 0–40)

## 2023-01-27 LAB — BASIC METABOLIC PANEL
Anion gap: 11 (ref 5–15)
BUN: 12 mg/dL (ref 6–20)
CO2: 28 mmol/L (ref 22–32)
Calcium: 8.7 mg/dL — ABNORMAL LOW (ref 8.9–10.3)
Chloride: 98 mmol/L (ref 98–111)
Creatinine, Ser: 0.68 mg/dL (ref 0.44–1.00)
GFR, Estimated: >60 mL/min (ref >60)
Glucose, Bld: 113 mg/dL — ABNORMAL HIGH (ref 70–99)
Potassium: 3.2 mmol/L — ABNORMAL LOW (ref 3.5–5.1)
Sodium: 137 mmol/L (ref 135–145)

## 2023-01-27 LAB — HEMOGLOBIN A1C
Hgb A1c MFr Bld: 6.2 % — ABNORMAL HIGH (ref 4.8–5.6)
Mean Plasma Glucose: 131.24 mg/dL

## 2023-01-27 LAB — HEPARIN LEVEL (UNFRACTIONATED)
Heparin Unfractionated: 0.85 IU/mL — ABNORMAL HIGH (ref 0.30–0.70)
Heparin Unfractionated: 0.83 IU/mL — ABNORMAL HIGH (ref 0.30–0.70)

## 2023-01-27 LAB — ECHOCARDIOGRAM COMPLETE
AR max vel: 2.66 cm2
AV Peak grad: 9 mmHg
Ao pk vel: 1.5 m/s
Area-P 1/2: 3.53 cm2
Height: 66 in
S' Lateral: 2.9 cm
Weight: 3024 oz

## 2023-01-27 LAB — PREGNANCY, URINE: Preg Test, Ur: NEGATIVE

## 2023-01-27 LAB — MAGNESIUM: Magnesium: 2.2 mg/dL (ref 1.7–2.4)

## 2023-01-27 LAB — HIV ANTIBODY (ROUTINE TESTING W REFLEX): HIV Screen 4th Generation wRfx: NONREACTIVE

## 2023-01-27 SURGERY — LEFT HEART CATH AND CORONARY ANGIOGRAPHY
Anesthesia: LOCAL

## 2023-01-27 MED ORDER — LIDOCAINE HCL (PF) 1 % IJ SOLN
INTRAMUSCULAR | Status: AC
  Filled 2023-01-27: qty 30

## 2023-01-27 MED ORDER — ONDANSETRON HCL 4 MG/2ML IJ SOLN: 4.0000 mg | Freq: Four times a day (QID) | INTRAMUSCULAR | Status: DC | PRN

## 2023-01-27 MED ORDER — ASPIRIN 81 MG PO TBEC
81.0000 mg | DELAYED_RELEASE_TABLET | Freq: Every day | ORAL | 2 refills | Status: AC
  Filled 2023-01-27: qty 120, 120d supply, fill #0

## 2023-01-27 MED ORDER — IOHEXOL 350 MG/ML SOLN
INTRAVENOUS | Status: DC | PRN
  Administered 2023-01-27: 80 mL

## 2023-01-27 MED ORDER — NITROGLYCERIN 1 MG/10 ML FOR IR/CATH LAB
INTRA_ARTERIAL | Status: AC
  Filled 2023-01-27: qty 10

## 2023-01-27 MED ORDER — ROSUVASTATIN CALCIUM 20 MG PO TABS
20.0000 mg | ORAL_TABLET | Freq: Every day | ORAL | 5 refills | Status: DC
  Filled 2023-01-27: qty 30, 30d supply, fill #0

## 2023-01-27 MED ORDER — LABETALOL HCL 5 MG/ML IV SOLN: 10.0000 mg | INTRAVENOUS | Status: DC | PRN

## 2023-01-27 MED ORDER — NITROGLYCERIN 1 MG/10 ML FOR IR/CATH LAB
INTRA_ARTERIAL | Status: DC | PRN
  Administered 2023-01-27: 100 ug via INTRACORONARY

## 2023-01-27 MED ORDER — VERAPAMIL HCL 2.5 MG/ML IV SOLN
INTRAVENOUS | Status: AC
  Filled 2023-01-27: qty 2

## 2023-01-27 MED ORDER — SODIUM CHLORIDE 0.9% FLUSH: 3.0000 mL | INTRAVENOUS | Status: DC | PRN

## 2023-01-27 MED ORDER — SODIUM CHLORIDE 0.9 % IV SOLN: 250.0000 mL | INTRAVENOUS | Status: DC | PRN

## 2023-01-27 MED ORDER — SODIUM CHLORIDE 0.9% FLUSH: 3.0000 mL | Freq: Two times a day (BID) | INTRAVENOUS | Status: DC

## 2023-01-27 MED ORDER — POTASSIUM CHLORIDE CRYS ER 20 MEQ PO TBCR
40.0000 meq | EXTENDED_RELEASE_TABLET | ORAL | Status: AC
  Administered 2023-01-27 (×2): 40 meq via ORAL
  Filled 2023-01-27: qty 2

## 2023-01-27 MED ORDER — SODIUM CHLORIDE 0.9 % IV SOLN: INTRAVENOUS | Status: DC

## 2023-01-27 MED ORDER — HEPARIN (PORCINE) IN NACL 1000-0.9 UT/500ML-% IV SOLN
INTRAVENOUS | Status: DC | PRN
  Administered 2023-01-27 (×2): 500 mL

## 2023-01-27 MED ORDER — HEPARIN BOLUS VIA INFUSION
2000.0000 [IU] | Freq: Once | INTRAVENOUS | Status: AC
  Administered 2023-01-27: 2000 [IU] via INTRAVENOUS
  Filled 2023-01-27: qty 2000

## 2023-01-27 MED ORDER — HEPARIN (PORCINE) 25000 UT/250ML-% IV SOLN
1100.0000 [IU]/h | INTRAVENOUS | Status: DC
  Administered 2023-01-27: 1250 [IU]/h via INTRAVENOUS
  Filled 2023-01-27: qty 250

## 2023-01-27 MED ORDER — LIDOCAINE HCL (PF) 1 % IJ SOLN
INTRAMUSCULAR | Status: DC | PRN
  Administered 2023-01-27: 2 mL

## 2023-01-27 MED ORDER — MIDAZOLAM HCL 2 MG/2ML IJ SOLN
INTRAMUSCULAR | Status: DC | PRN
  Administered 2023-01-27: 1 mg via INTRAVENOUS

## 2023-01-27 MED ORDER — POTASSIUM CHLORIDE CRYS ER 20 MEQ PO TBCR
40.0000 meq | EXTENDED_RELEASE_TABLET | Freq: Two times a day (BID) | ORAL | Status: DC
  Filled 2023-01-27: qty 2

## 2023-01-27 MED ORDER — FENTANYL CITRATE (PF) 100 MCG/2ML IJ SOLN
INTRAMUSCULAR | Status: DC | PRN
  Administered 2023-01-27: 25 ug via INTRAVENOUS

## 2023-01-27 MED ORDER — VERAPAMIL HCL 2.5 MG/ML IV SOLN
INTRAVENOUS | Status: DC | PRN
  Administered 2023-01-27: 10 mL via INTRA_ARTERIAL

## 2023-01-27 MED ORDER — ISOSORBIDE MONONITRATE ER 30 MG PO TB24
30.0000 mg | ORAL_TABLET | Freq: Every day | ORAL | 3 refills | Status: DC
  Filled 2023-01-27: qty 30, 30d supply, fill #0

## 2023-01-27 MED ORDER — HEPARIN SODIUM (PORCINE) 1000 UNIT/ML IJ SOLN
INTRAMUSCULAR | Status: AC
  Filled 2023-01-27: qty 10

## 2023-01-27 MED ORDER — HEPARIN SODIUM (PORCINE) 1000 UNIT/ML IJ SOLN
INTRAMUSCULAR | Status: DC | PRN
  Administered 2023-01-27: 5000 [IU] via INTRAVENOUS

## 2023-01-27 MED ORDER — ACETAMINOPHEN 325 MG PO TABS: 650.0000 mg | ORAL_TABLET | ORAL | Status: DC | PRN

## 2023-01-27 MED ORDER — FENTANYL CITRATE (PF) 100 MCG/2ML IJ SOLN
INTRAMUSCULAR | Status: AC
  Filled 2023-01-27: qty 2

## 2023-01-27 MED ORDER — HYDRALAZINE HCL 20 MG/ML IJ SOLN: 10.0000 mg | INTRAMUSCULAR | Status: DC | PRN

## 2023-01-27 MED ORDER — MIDAZOLAM HCL 2 MG/2ML IJ SOLN
INTRAMUSCULAR | Status: AC
  Filled 2023-01-27: qty 2

## 2023-01-27 SURGICAL SUPPLY — 9 items
CATH INFINITI 5FR ANG PIGTAIL (CATHETERS) IMPLANT
CATH OPTITORQUE TIG 4.0 6F (CATHETERS) IMPLANT
GLIDESHEATH SLEND SS 6F .021 (SHEATH) IMPLANT
KIT HEART LEFT (KITS) ×1 IMPLANT
PACK CARDIAC CATHETERIZATION (CUSTOM PROCEDURE TRAY) ×1 IMPLANT
SYR MEDRAD MARK 7 150ML (SYRINGE) ×1 IMPLANT
TRANSDUCER W/STOPCOCK (MISCELLANEOUS) ×1 IMPLANT
TUBING CIL FLEX 10 FLL-RA (TUBING) ×1 IMPLANT
WIRE EMERALD 3MM-J .035X260CM (WIRE) IMPLANT

## 2023-01-27 NOTE — Progress Notes (Signed)
Rounding Note    Patient Name: Ann Delacruz Date of Encounter: 01/27/2023  [facility] Cardiologist: Marjo Bicker, MD   Subjective   Patient without any significant complaints today.  No chest pain.  Has plans for cardiac catheterization today.  She did state that she had some back pain and has known history of herniated disc which has flared up, however manageable with tylenol.   Inpatient Medications    Scheduled Meds:  aspirin EC  81 mg Oral Daily   nicotine  21 mg Transdermal Once   nitroGLYCERIN  0.4 mg Sublingual Once   potassium chloride SA  40 mEq Oral Q4H   rosuvastatin  40 mg Oral Daily   sodium chloride flush  3 mL Intravenous Q12H   Continuous Infusions:  sodium chloride     sodium chloride 1 mL/kg/hr (01/27/23 0805)   heparin 1,250 Units/hr (01/27/23 0439)   PRN Meds: sodium chloride, acetaminophen, nitroGLYCERIN, ondansetron (ZOFRAN) IV, sodium chloride flush   Vital Signs    Vitals:   01/26/23 1954 01/27/23 0008 01/27/23 0745 01/27/23 0855  BP: 132/80 113/64  118/73  Pulse: 81 78 76 77  Resp: 19 19  14   Temp: 98.1 F (36.7 C) 98 F (36.7 C)  97.8 F (36.6 C)  TempSrc: Oral Oral  Oral  SpO2: 97% 94% 98% 95%  Weight:      Height:        Intake/Output Summary (Last 24 hours) at 01/27/2023 1036 Last data filed at 01/27/2023 0439 Gross per 24 hour  Intake 255.52 ml  Output --  Net 255.52 ml      01/26/2023   12:09 PM 09/12/2022    7:22 PM 02/05/2021    8:45 AM  Last 3 Weights  Weight (lbs) 189 lb 203 lb 0.7 oz 203 lb  Weight (kg) 85.73 kg 92.1 kg 92.08 kg      Telemetry    Normal sinus rhythm heart rates in the 70s- Personally Reviewed  ECG    No new tracings. - Personally Reviewed  Physical Exam   GEN: No acute distress.   Neck: No JVD Cardiac: RRR, no murmurs, rubs, or gallops.  Respiratory: Clear to auscultation bilaterally. GI: Soft, nontender, non-distended  MS: No edema; No deformity. Neuro:  Nonfocal   Psych: Normal affect   Labs    High Sensitivity Troponin:   Recent Labs  Lab 01/26/23 1310 01/26/23 1429  TROPONINIHS 147* 343*     Chemistry Recent Labs  Lab 01/26/23 1310 01/27/23 0413  NA 137 137  K 2.7* 3.2*  CL 96* 98  CO2 31 28  GLUCOSE 112* 113*  BUN 12 12  CREATININE 0.69 0.68  CALCIUM 8.9 8.7*  PROT 6.9  --   ALBUMIN 3.6  --   AST 35  --   ALT 39  --   ALKPHOS 51  --   BILITOT 0.6  --   GFRNONAA >60 >60  ANIONGAP 10 11    Lipids  Recent Labs  Lab 01/27/23 0413  CHOL 223*  TRIG 172*  HDL 45  LDLCALC 144*  CHOLHDL 5.0    Hematology Recent Labs  Lab 01/26/23 1310  WBC 13.3*  RBC 4.39  HGB 14.8  HCT 44.5  MCV 101.4*  MCH 33.7  MCHC 33.3  RDW 14.1  PLT 278   Thyroid No results for input(s): "TSH", "FREET4" in the last 168 hours.  BNPNo results for input(s): "BNP", "PROBNP" in the last 168 hours.  DDimer  No results for input(s): "DDIMER" in the last 168 hours.   Radiology    CT Angio Chest/Abd/Pel for Dissection W and/or Wo Contrast  Result Date: 01/26/2023 CLINICAL DATA:  Acute aortic syndrome (AAS) suspected EXAM: CT ANGIOGRAPHY CHEST, ABDOMEN AND PELVIS TECHNIQUE: Non-contrast CT of the chest was initially obtained. Multidetector CT imaging through the chest, abdomen and pelvis was performed using the standard protocol during bolus administration of intravenous contrast. Multiplanar reconstructed images and MIPs were obtained and reviewed to evaluate the vascular anatomy. RADIATION DOSE REDUCTION: This exam was performed according to the departmental dose-optimization program which includes automated exposure control, adjustment of the mA and/or kV according to patient size and/or use of iterative reconstruction technique. CONTRAST:  OMNIPAQUE IOHEXOL 350 MG/ML SOLN COMPARISON:  November 22, 2019. FINDINGS: CTA CHEST FINDINGS Cardiovascular: Preferential opacification of the thoracic aorta. No evidence of thoracic aortic aneurysm or  dissection. No evidence of intramural hematoma. Normal heart size. No pericardial effusion. Scattered atherosclerotic calcifications. Mediastinum/Nodes: No axillary or mediastinal adenopathy. Visualized thyroid is unremarkable. Lungs/Pleura: No pleural effusion or pneumothorax. Unchanged 3 mm RIGHT lower lobe pulmonary nodule and 3 mm LEFT apical pulmonary nodule since 2021, consistent with a benign etiology (for which no dedicated imaging follow-up is recommended) (series 7, image 94, image 15). Musculoskeletal: No chest wall abnormality. No acute or significant osseous findings. Review of the MIP images confirms the above findings. CTA ABDOMEN AND PELVIS FINDINGS VASCULAR Aorta: Normal caliber aorta without aneurysm, dissection, vasculitis or significant stenosis. Celiac: Patent without evidence of aneurysm, dissection, vasculitis or significant stenosis. SMA: Patent without evidence of aneurysm, dissection, vasculitis or significant stenosis. Renals: Both renal arteries are patent without evidence of aneurysm, dissection, vasculitis, fibromuscular dysplasia or significant stenosis. IMA: Patent without evidence of aneurysm, dissection, vasculitis or significant stenosis. Inflow: Patent without evidence of aneurysm, dissection, vasculitis or significant stenosis. Atherosclerotic calcifications. Veins: No obvious venous abnormality within the limitations of this arterial phase study. Review of the MIP images confirms the above findings. NON-VASCULAR Hepatobiliary: Gallbladder is unremarkable. No focal hepatic lesion. No intrahepatic or extrahepatic biliary ductal dilation. Pancreas: Unremarkable. No pancreatic ductal dilatation or surrounding inflammatory changes. Spleen: Normal in size without focal abnormality. Adrenals/Urinary Tract: Adrenal glands are unremarkable. Kidneys enhance symmetrically. No hydronephrosis. Bilateral nonobstructing nephrolithiasis. No obstructing nephrolithiasis. Bladder is unremarkable.  Stomach/Bowel: No evidence of bowel obstruction. Appendix is normal. Stomach is unremarkable. Lymphatic: No suspicious lymphadenopathy. Reproductive: Uterus and bilateral adnexa are unremarkable. Other: No free air or free fluid. Musculoskeletal: Degenerative changes of L5-S1. Review of the MIP images confirms the above findings. IMPRESSION: 1. No evidence of acute aortic syndrome. 2. No acute findings within the chest, abdomen or pelvis. 3. Age advanced atherosclerotic calcifications. Aortic Atherosclerosis (ICD10-I70.0). Electronically Signed   By: Meda Klinefelter M.D.   On: 01/26/2023 13:32   DG Chest Port 1 View  Result Date: 01/26/2023 CLINICAL DATA:  Chest pain EXAM: PORTABLE CHEST 1 VIEW COMPARISON:  CXR 05/05/14 FINDINGS: The heart size and mediastinal contours are within normal limits. Both lungs are clear. The visualized skeletal structures are unremarkable. IMPRESSION: No active disease. Electronically Signed   By: Lorenza Cambridge M.D.   On: 01/26/2023 13:01    Cardiac Studies     Patient Profile     47 y.o. female with history of hypertension, tobacco abuse, herniated disc.  She presented to the emergency room with sudden onset of chest pain radiating to her shoulder blade associated with nausea.  EKG with no ischemic changes, troponins 147  then 343.  She has plans for cardiac catheterization today.  Risk factors include family history, smoking status, prediabetes, HLD. Assessment & Plan   NSTEMI No ischemic changes on EKG, had elevated troponins that increased from 147-143.  Not currently having chest pain.  Continue plan with cardiac catheterization today. On heparin Echocardiogram pending LP(a) in process Has gotten aspirin this morning, continue.  Can consider BB depending on cath results. Statin as below.   Hypertension Well-controlled throughout this admission.  PTA HCTZ 25 mg daily and olmesartan 5 mg.  Continue to hold, may need to adjust depending on catheterization  results and echo. Likely will not resume HCTZ given profound hypokalemia at admission.   Hypokalemia  Initially presented with potassium of 2.7 and has received supplementation.  No obvious etiology at this time, denies any poor oral intake or vomiting.  States that she is very active and works at [employer] and has been sweating a lot.  Still remains to be low at 3.2, will continue to supplement. Recheck tomorrow morning. - Mag pending  Hyperlipidemia LDL 144.  She's been started on rosuvastatin 40mg  daily  Tobacco abuse Patient states that she smokes 2 packs/day.  Currently on a nicotine patch.  Somewhat interested in quitting, she has been counseled on risks and encouraged to quit.  Prediabetes A1c 6.2%  Leukocytosis Question if related to MI or chronic finding.  It appears that she has been persistently elevated before in the past.  Continue to monitor.  For questions or updates, please contact [facility] Please consult [email or web address] for contact info under        Signed, Abagail Kitchens, PA-C  01/27/2023, 10:36 AM

## 2023-01-27 NOTE — Progress Notes (Signed)
ANTICOAGULATION CONSULT NOTE  Pharmacy Consult for heparin Indication: chest pain/ACS  Allergies  Allergen Reactions   Penicillins     Childhood allergy    Patient Measurements: Height: 5\' 6"  (167.6 cm) Weight: 85.7 kg (189 lb) IBW/kg (Calculated) : 59.3 Heparin Dosing Weight: 77 kg  Vital Signs: Temp: 97.8 F (36.6 C) (05/17 0855) Temp Source: Oral (05/17 0855) BP: 118/73 (05/17 0855) Pulse Rate: 77 (05/17 0855)  Labs: Recent Labs    01/26/23 1310 01/26/23 1429 01/26/23 2137 01/27/23 0413 01/27/23 1056  HGB 14.8  --   --   --   --   HCT 44.5  --   --   --   --   PLT 278  --   --   --   --   HEPARINUNFRC  --   --  0.13* 0.85* 0.83*  CREATININE 0.69  --   --  0.68  --   TROPONINIHS 147* 343*  --   --   --      Estimated Creatinine Clearance: 97 mL/min (by C-G formula based on SCr of 0.68 mg/dL).   Medical History: Past Medical History:  Diagnosis Date   Asthma    Hypertension     Medications:  Medications Prior to Admission  Medication Sig Dispense Refill Last Dose   albuterol (PROVENTIL HFA;VENTOLIN HFA) 108 (90 BASE) MCG/ACT inhaler Inhale 2 puffs into the lungs every 6 (six) hours as needed for wheezing or shortness of breath.   01/26/2023   Aspirin-Caffeine (BC FAST PAIN RELIEF PO) Take 1 Package by mouth daily as needed (pain).   01/26/2023   beclomethasone (QVAR) 40 MCG/ACT inhaler Inhale into the lungs.      gabapentin (NEURONTIN) 300 MG capsule Take by mouth See admin instructions. Take 1 capsule by mouth 3 times a day x 4 days, then take 1 capsule at bedtime x 4 days, take 1 capsule three times daily until finished      hydrochlorothiazide (HYDRODIURIL) 25 MG tablet Take 25 mg by mouth daily.   01/26/2023   olmesartan (BENICAR) 5 MG tablet Take 5 mg by mouth daily.   01/26/2023    Assessment: Pharmacy consulted to dose heparin in patient with chest pain/ACS.  Patient is not on anticoagulation prior to admission. Plans noted for cath today  Heparin  level 0.83 (supratherapeutic) on infusion at 1250 units/hr.   Goal of Therapy:  Heparin level 0.3-0.7 units/ml Monitor platelets by anticoagulation protocol: Yes   Plan:  -Decrease heparin to 1100 units/hr -Will follow plans post cath  Harland German, PharmD Clinical Pharmacist **Pharmacist phone directory can now be found on [email or web address] ([email or web address]).  Listed under [facility].

## 2023-01-27 NOTE — Progress Notes (Signed)
Echocardiogram 2D Echocardiogram has been performed.  Lucendia Herrlich 01/27/2023, 3:07 PM

## 2023-01-27 NOTE — Discharge Instructions (Signed)
Please go to [facility] hospital within one week to check your potassium. They have your orders so all you need to do is let the front person know you are there for outpatient labs.

## 2023-01-27 NOTE — Telephone Encounter (Signed)
   Transition of Care Follow-up Phone Call Request    Patient Name: Kashara Hamman Date of Birth: 1976-01-04 Date of Encounter: 01/27/2023  Primary Care Provider:  Enid Baas, MD Primary Cardiologist:  Marjo Bicker, MD  [name] has been scheduled for a transition of care follow up appointment with a [facility] provider: Sharlene Dory on 02/10/23   Please reach out to [facility] within 48 hours to confirm appointment.  Abagail Kitchens, PA-C  01/27/2023, 2:30 PM

## 2023-01-27 NOTE — Discharge Summary (Addendum)
Discharge Summary    Patient ID: Ann Delacruz MRN: [id number]; DOB: 1976/04/21  Admit date: 01/26/2023 Discharge date: 01/27/2023  PCP:  Enid Baas, MD   [facility] Providers Cardiologist:  Marjo Bicker, MD   {   Discharge Diagnoses    Principal Problem:   Chest pain Active Problems:   Tobacco abuse   Hyperlipidemia   Hypertension    Diagnostic Studies/Procedures    Left heart catheterization 01/27/2023   Dist Cx lesion is 30% stenosed.   Ost RCA to Prox RCA lesion is 10% stenosed.   1.  Very mild obstructive coronary artery disease. 2.  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 3.  LVEDP of 8 mmHg with preserved LV function with no evidence of Takotsubo cardiomyopathy.   Recommendation: Medical therapy.  Echocardiogram 01/27/2023  1. Left ventricular ejection fraction, by estimation, is 60 to 65%. The  left ventricle has normal function. The left ventricle has no regional  wall motion abnormalities. Left ventricular diastolic parameters were  normal.   2. Right ventricular systolic function is normal. The right ventricular  size is normal.   3. The mitral valve is normal in structure. Trivial mitral valve  regurgitation. No evidence of mitral stenosis.   4. The aortic valve is normal in structure. Aortic valve regurgitation is  not visualized. No aortic stenosis is present.   5. The inferior vena cava is normal in size with greater than 50%  respiratory variability, suggesting right atrial pressure of 3 mmHg.   _____________   History of Present Illness     Ann Delacruz is a 47 y.o. female with hypertension, tobacco abuse, prediabetic, hyperlipidemia.  Patient has family history significant for coronary artery disease, but no personal cardiac history. She presented to the emergency room yesterday on 01/26/2023 with complaints of sudden onset of chest pain that radiated to  her shoulder blade and was associated with nausea.  In the emergency room she received Dilaudid, Ativan, pantoprazole with relief of symptoms.  She did note to have feelings of chest pain that also radiated to her face for the last 30 days.  EKG did not show any ischemic changes.  However, troponins were elevated and have increased from 147-343.  Due to elevated troponins and multiple risk factors for coronary artery disease patient was transferred from [facility] to [facility] and underwent cardiac catheterization due to concerns of NSTEMI.  Hospital Course     Consultants:    Chest pain likely due to coronary spasm Mild obstructive CAD No ischemic changes on EKG, had elevated troponins that increased from 147-143.   Cardiac catheterization indicated 30% stenosis in distal circumflex and 10% and RCA.  Overall she had very mild obstructive coronary artery disease.  Additionally, had induced spasm of the RCA once catheter was inserted.  LVEDP of 8 mmHg with preserved LV function and no evidence of Takotsubo. Based on findings, Dr. Swaziland suspected that initial event may have been coronary vasospasm. Echocardiogram showing normal LVEF LP(a) in process Continue aspirin 81mg  daily.  Statin added below Imdur 30mg  will be initiated as anti-spasm medication  Hypertension Well-controlled throughout this admission.  PTA HCTZ 25 mg daily and olmesartan 5 mg.   We will discontinue her HCTZ due to profound hypokalemia on presentation She can continue her home olmesartan 5 mg   Hypokalemia  Initially presented with potassium of 2.7 and has received supplementation.  No obvious etiology at this time, denies any  poor oral intake or vomiting.  States that she is very active and works at [employer] and has been sweating a lot.  Still remains to be low at 3.2, will continue to supplement.  She has gotten of potassium this morning, will get one more dose prior to discharge. Will recheck in 1 week, she as been  instructed to go to [facility] lab to repeat BMET. No appointment necessary. Order has been placed.  Magnesium 2.2   Hyperlipidemia LDL 144.  She's been started on rosuvastatin 20mg  daily. Recommend recheck liver/lipids in 6-8 weeks   Tobacco abuse Patient states that she smokes 2 packs/day.  Currently on a nicotine patch.  Somewhat interested in quitting, she has been counseled on risks and encouraged to quit.   Prediabetes A1c 6.2%   Leukocytosis  It appears that she has been persistently elevated before in the past. No infectious symptoms. Recommended to f/u with primary care for monitoring.   Patient has been seen by myself and Dr. Swaziland.  She is deemed stable for discharge.  Outpatient follow-up has been made. Work note has been made and she can return to work on 02/04/2023. She will get labs done at [facility].   Did the patient have an acute coronary syndrome (MI, NSTEMI, STEMI, etc) this admission?:  No                               Did the patient have a percutaneous coronary intervention (stent / angioplasty)?:  No.        The patient will be scheduled for a TOC follow up appointment in 14 days.  A message has been sent to the [facility] and Scheduling Pool at the office where the patient should be seen for follow up.  _____________  Discharge Vitals Blood pressure 114/73, pulse 79, temperature 97.8 F (36.6 C), temperature source Oral, resp. rate (!) 26, height 5\' 6"  (1.676 m), weight 85.7 kg, SpO2 100 %.  Filed Weights   01/26/23 1209  Weight: 85.7 kg    Labs & Radiologic Studies    CBC Recent Labs    01/26/23 1310  WBC 13.3*  HGB 14.8  HCT 44.5  MCV 101.4*  PLT 278   Basic Metabolic Panel Recent Labs    16/10/96 1310 01/27/23 0413 01/27/23 1056  NA 137 137  --   K 2.7* 3.2*  --   CL 96* 98  --   CO2 31 28  --   GLUCOSE 112* 113*  --   BUN 12 12  --   CREATININE 0.69 0.68  --   CALCIUM 8.9 8.7*  --   MG  --   --  2.2   Liver Function Tests Recent Labs     01/26/23 1310  AST 35  ALT 39  ALKPHOS 51  BILITOT 0.6  PROT 6.9  ALBUMIN 3.6   No results for input(s): "LIPASE", "AMYLASE" in the last 72 hours. High Sensitivity Troponin:   Recent Labs  Lab 01/26/23 1310 01/26/23 1429  TROPONINIHS 147* 343*    BNP Invalid input(s): "POCBNP" D-Dimer No results for input(s): "DDIMER" in the last 72 hours. Hemoglobin A1C Recent Labs    01/27/23 0413  HGBA1C 6.2*   Fasting Lipid Panel Recent Labs    01/27/23 0413  CHOL 223*  HDL 45  LDLCALC 144*  TRIG 172*  CHOLHDL 5.0   Thyroid Function Tests No results for input(s): "TSH", "  T4TOTAL", "T3FREE", "THYROIDAB" in the last 72 hours.  Invalid input(s): "FREET3" _____________  ECHOCARDIOGRAM COMPLETE  Result Date: 01/27/2023    ECHOCARDIOGRAM REPORT   Patient Name:   Ann Delacruz Date of Exam: 01/27/2023 Medical Rec #:  [id number]          Height:       66.0 in Accession #:    [id number]         Weight:       189.0 lb Date of Birth:  09/01/1976          BSA:          1.952 m Patient Age:    47 years           BP:           113/64 mmHg Patient Gender: F                  HR:           75 bpm. Exam Location:  Inpatient Procedure: 2D Echo, Cardiac Doppler and Color Doppler Indications:    NSTEMI I21.4  History:        Patient has no prior history of Echocardiogram examinations.                 Previous Myocardial Infarction; Risk Factors:Hypertension.  Sonographer:    Lucendia Herrlich Referring Phys: Randall An, M IMPRESSIONS  1. Left ventricular ejection fraction, by estimation, is 60 to 65%. The left ventricle has normal function. The left ventricle has no regional wall motion abnormalities. Left ventricular diastolic parameters were normal.  2. Right ventricular systolic function is normal. The right ventricular size is normal.  3. The mitral valve is normal in structure. Trivial mitral valve regurgitation. No evidence of mitral stenosis.  4. The aortic valve is normal in structure.  Aortic valve regurgitation is not visualized. No aortic stenosis is present.  5. The inferior vena cava is normal in size with greater than 50% respiratory variability, suggesting right atrial pressure of 3 mmHg. FINDINGS  Left Ventricle: Left ventricular ejection fraction, by estimation, is 60 to 65%. The left ventricle has normal function. The left ventricle has no regional wall motion abnormalities. The left ventricular internal cavity size was normal in size. There is  no left ventricular hypertrophy. Left ventricular diastolic parameters were normal. Right Ventricle: The right ventricular size is normal. No increase in right ventricular wall thickness. Right ventricular systolic function is normal. Left Atrium: Left atrial size was normal in size. Right Atrium: Right atrial size was normal in size. Pericardium: There is no evidence of pericardial effusion. Mitral Valve: The mitral valve is normal in structure. Trivial mitral valve regurgitation. No evidence of mitral valve stenosis. Tricuspid Valve: The tricuspid valve is normal in structure. Tricuspid valve regurgitation is trivial. No evidence of tricuspid stenosis. Aortic Valve: The aortic valve is normal in structure. Aortic valve regurgitation is not visualized. No aortic stenosis is present. Aortic valve peak gradient measures 9.0 mmHg. Pulmonic Valve: The pulmonic valve was normal in structure. Pulmonic valve regurgitation is not visualized. No evidence of pulmonic stenosis. Aorta: The aortic root is normal in size and structure. Venous: The inferior vena cava is normal in size with greater than 50% respiratory variability, suggesting right atrial pressure of 3 mmHg. IAS/Shunts: No atrial level shunt detected by color flow Doppler.  LEFT VENTRICLE PLAX 2D LVIDd:         4.40 cm   Diastology LVIDs:  2.90 cm   LV e' medial:    10.30 cm/s LV PW:         0.90 cm   LV E/e' medial:  8.4 LV IVS:        0.70 cm   LV e' lateral:   11.90 cm/s LVOT diam:      2.10 cm   LV E/e' lateral: 7.3 LV SV:         75 LV SV Index:   38 LVOT Area:     3.46 cm  RIGHT VENTRICLE             IVC RV S prime:     13.80 cm/s  IVC diam: 2.20 cm TAPSE (M-mode): 2.2 cm LEFT ATRIUM             Index        RIGHT ATRIUM           Index LA diam:        3.70 cm 1.90 cm/m   RA Area:     17.30 cm LA Vol (A2C):   39.4 ml 20.18 ml/m  RA Volume:   43.10 ml  22.08 ml/m LA Vol (A4C):   36.5 ml 18.70 ml/m LA Biplane Vol: 39.6 ml 20.28 ml/m  AORTIC VALVE AV Area (Vmax): 2.66 cm AV Vmax:        150.00 cm/s AV Peak Grad:   9.0 mmHg LVOT Vmax:      115.00 cm/s LVOT Vmean:     73.900 cm/s LVOT VTI:       0.217 m  AORTA Ao Root diam: 3.10 cm Ao Asc diam:  2.80 cm MITRAL VALVE               TRICUSPID VALVE MV Area (PHT): 3.53 cm    TR Peak grad:   18.0 mmHg MV Decel Time: 215 msec    TR Vmax:        212.00 cm/s MV E velocity: 87.00 cm/s MV A velocity: 62.40 cm/s  SHUNTS MV E/A ratio:  1.39        Systemic VTI:  0.22 m                            Systemic Diam: 2.10 cm Arvilla Meres MD Electronically signed by Arvilla Meres MD Signature Date/Time: 01/27/2023/3:39:53 PM    Final    CARDIAC CATHETERIZATION  Result Date: 01/27/2023   Dist Cx lesion is 30% stenosed.   Ost RCA to Prox RCA lesion is 10% stenosed. 1.  Very mild obstructive coronary artery disease. 2.  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 3.  LVEDP of 8 mmHg with preserved LV function with no evidence of Takotsubo cardiomyopathy. Recommendation: Medical therapy.   CT Angio Chest/Abd/Pel for Dissection W and/or Wo Contrast  Result Date: 01/26/2023 CLINICAL DATA:  Acute aortic syndrome (AAS) suspected EXAM: CT ANGIOGRAPHY CHEST, ABDOMEN AND PELVIS TECHNIQUE: Non-contrast CT of the chest was initially obtained. Multidetector CT imaging through the chest, abdomen and pelvis was performed using the standard protocol during bolus administration of intravenous contrast. Multiplanar  reconstructed images and MIPs were obtained and reviewed to evaluate the vascular anatomy. RADIATION DOSE REDUCTION: This exam was performed according to the departmental dose-optimization program which includes automated exposure control, adjustment of the mA and/or kV according to patient size and/or use of iterative reconstruction technique. CONTRAST:  OMNIPAQUE IOHEXOL 350 MG/ML  SOLN COMPARISON:  November 22, 2019. FINDINGS: CTA CHEST FINDINGS Cardiovascular: Preferential opacification of the thoracic aorta. No evidence of thoracic aortic aneurysm or dissection. No evidence of intramural hematoma. Normal heart size. No pericardial effusion. Scattered atherosclerotic calcifications. Mediastinum/Nodes: No axillary or mediastinal adenopathy. Visualized thyroid is unremarkable. Lungs/Pleura: No pleural effusion or pneumothorax. Unchanged 3 mm RIGHT lower lobe pulmonary nodule and 3 mm LEFT apical pulmonary nodule since 2021, consistent with a benign etiology (for which no dedicated imaging follow-up is recommended) (series 7, image 94, image 15). Musculoskeletal: No chest wall abnormality. No acute or significant osseous findings. Review of the MIP images confirms the above findings. CTA ABDOMEN AND PELVIS FINDINGS VASCULAR Aorta: Normal caliber aorta without aneurysm, dissection, vasculitis or significant stenosis. Celiac: Patent without evidence of aneurysm, dissection, vasculitis or significant stenosis. SMA: Patent without evidence of aneurysm, dissection, vasculitis or significant stenosis. Renals: Both renal arteries are patent without evidence of aneurysm, dissection, vasculitis, fibromuscular dysplasia or significant stenosis. IMA: Patent without evidence of aneurysm, dissection, vasculitis or significant stenosis. Inflow: Patent without evidence of aneurysm, dissection, vasculitis or significant stenosis. Atherosclerotic calcifications. Veins: No obvious venous abnormality within the limitations of this  arterial phase study. Review of the MIP images confirms the above findings. NON-VASCULAR Hepatobiliary: Gallbladder is unremarkable. No focal hepatic lesion. No intrahepatic or extrahepatic biliary ductal dilation. Pancreas: Unremarkable. No pancreatic ductal dilatation or surrounding inflammatory changes. Spleen: Normal in size without focal abnormality. Adrenals/Urinary Tract: Adrenal glands are unremarkable. Kidneys enhance symmetrically. No hydronephrosis. Bilateral nonobstructing nephrolithiasis. No obstructing nephrolithiasis. Bladder is unremarkable. Stomach/Bowel: No evidence of bowel obstruction. Appendix is normal. Stomach is unremarkable. Lymphatic: No suspicious lymphadenopathy. Reproductive: Uterus and bilateral adnexa are unremarkable. Other: No free air or free fluid. Musculoskeletal: Degenerative changes of L5-S1. Review of the MIP images confirms the above findings. IMPRESSION: 1. No evidence of acute aortic syndrome. 2. No acute findings within the chest, abdomen or pelvis. 3. Age advanced atherosclerotic calcifications. Aortic Atherosclerosis (ICD10-I70.0). Electronically Signed   By: Meda Klinefelter M.D.   On: 01/26/2023 13:32   DG Chest Port 1 View  Result Date: 01/26/2023 CLINICAL DATA:  Chest pain EXAM: PORTABLE CHEST 1 VIEW COMPARISON:  CXR 05/05/14 FINDINGS: The heart size and mediastinal contours are within normal limits. Both lungs are clear. The visualized skeletal structures are unremarkable. IMPRESSION: No active disease. Electronically Signed   By: Lorenza Cambridge M.D.   On: 01/26/2023 13:01   Disposition   Pt is being discharged home today in good condition.  Follow-up Plans & Appointments     Follow-up Information     Sharlene Dory, NP Follow up.   Specialty: Cardiology Why: [facility] - [place] location - cardiology follow-up Friday Feb 10, 2023 2:30 PM (Arrive by 2:15 PM). Lanora Manis is one of our nurse practitioners with our team. Contact information: [address] [place] [address] [phone]         Enid Baas, MD Follow up.   Specialty: Internal Medicine Why: Your white blood cell count has been elevated on labwork and looks to have been that way in the past as well. Please follow up with your primary care provider to have this rechecked. Contact information: [address] [place] [address] [phone]                Discharge Instructions     AMB referral to Phase II Cardiac Rehabilitation   Complete by: As directed    Diagnosis: NSTEMI   After  initial evaluation and assessments completed: Virtual Based Care may be provided alone or in conjunction with Phase 2 Cardiac Rehab based on patient barriers.: Yes   Intensive Cardiac Rehabilitation (ICR) [facility] location only OR Traditional Cardiac Rehabilitation (TCR) *If criteria for ICR are not met will enroll in TCR [facility] only): Yes   Diet - low sodium heart healthy   Complete by: As directed    Increase activity slowly   Complete by: As directed    No lifting over 5 lbs for 1 week. No sexual activity for 1 week. You may return to work on 02/04/2023. Keep procedure site clean & dry. If you notice increased pain, swelling, bleeding or pus, call/return!  You may shower, but no soaking baths/hot tubs/pools for 1 week.        Discharge Medications   Allergies as of 01/27/2023       Reactions   Penicillins    Childhood allergy        Medication List     STOP taking these medications    hydrochlorothiazide 25 MG tablet Commonly known as: HYDRODIURIL       TAKE these medications    albuterol 108 (90 Base) MCG/ACT inhaler Commonly known as: VENTOLIN HFA Inhale 2 puffs into the lungs every 6 (six) hours as needed for wheezing or shortness of breath.   aspirin EC 81 MG tablet Take 1 tablet (81 mg total) by mouth daily. Swallow whole. Start taking on: Jan 28, 2023   [facility] FAST PAIN RELIEF PO Take 1 Package by mouth daily as needed  (pain).   beclomethasone 40 MCG/ACT inhaler Commonly known as: QVAR Inhale into the lungs.   gabapentin 300 MG capsule Commonly known as: NEURONTIN Take by mouth See admin instructions. Take 1 capsule by mouth 3 times a day x 4 days, then take 1 capsule at bedtime x 4 days, take 1 capsule three times daily until finished   isosorbide mononitrate 30 MG 24 hr tablet Commonly known as: IMDUR Take 1 tablet (30 mg total) by mouth daily.   olmesartan 5 MG tablet Commonly known as: BENICAR Take 5 mg by mouth daily.   rosuvastatin 20 MG tablet Commonly known as: CRESTOR Take 1 tablet (20 mg total) by mouth daily. Start taking on: Jan 28, 2023           Outstanding Labs/Studies   BMET to be done outpatient. Orders are in.   Duration of Discharge Encounter   Greater than 30 minutes including physician time.  Signed, Abagail Kitchens, PA-C 01/27/2023, 4:16 PM

## 2023-01-27 NOTE — Care Management (Signed)
  Transition of Care [facility]) Screening Note   Patient Details  Name: Ann Delacruz Date of Birth: Oct 30, 1975   Transition of Care [facility]) CM/SW Contact:    Gala Lewandowsky, RN Phone Number: 01/27/2023, 10:51 AM    Transition of Care Department [facility]) has reviewed the patient. Patient presented for chest pain-plan for LHC today. Patient is without insurance; however, she has a PCP  @ [facility] in [place]. Patient uses the [facility] in [place]. Case Manager explained the [facility] program to the patient if eligible. We will continue to monitor patient advancement through interdisciplinary progression rounds. If new patient transition needs arise, please place a TOC consult.

## 2023-01-27 NOTE — TOC Transition Note (Signed)
Transition of Care [facility]) - CM/SW Discharge Note   Patient Details  Name: Lakya Simental MRN: [id number] Date of Birth: 25-Dec-1975  Transition of Care [facility]) CM/SW Contact:  Kermit Balo, RN Phone Number: 01/27/2023, 4:28 PM   Clinical Narrative:    Pt is discharging home with self care.  D/c medications are $15.83. Pt states she can afford the meds.  Pt has transportation home.    Final next level of care: Home/Self Care Barriers to Discharge: Barriers Unresolved (comment), Inadequate or no insurance   Patient Goals and CMS Choice      Discharge Placement                         Discharge Plan and Services Additional resources added to the After Visit Summary for                                       Social Determinants of Health (SDOH) Interventions SDOH Screenings   Tobacco Use: High Risk (01/26/2023)     Readmission Risk Interventions     No data to display

## 2023-01-27 NOTE — Progress Notes (Addendum)
ANTICOAGULATION CONSULT NOTE  Pharmacy Consult for heparin Indication: chest pain/ACS  Allergies  Allergen Reactions   Penicillins     Childhood allergy    Patient Measurements: Height: 5\' 6"  (167.6 cm) Weight: 85.7 kg (189 lb) IBW/kg (Calculated) : 59.3 Heparin Dosing Weight: 77 kg  Vital Signs: Temp: 98 F (36.7 C) (05/17 0008) Temp Source: Oral (05/17 0008) BP: 113/64 (05/17 0008) Pulse Rate: 78 (05/17 0008)  Labs: Recent Labs    01/26/23 1310 01/26/23 1429  HGB 14.8  --   HCT 44.5  --   PLT 278  --   CREATININE 0.69  --   TROPONINIHS 147* 343*     Estimated Creatinine Clearance: 97 mL/min (by C-G formula based on SCr of 0.69 mg/dL).   Medical History: Past Medical History:  Diagnosis Date   Asthma    Hypertension     Medications:  Medications Prior to Admission  Medication Sig Dispense Refill Last Dose   albuterol (PROVENTIL HFA;VENTOLIN HFA) 108 (90 BASE) MCG/ACT inhaler Inhale 2 puffs into the lungs every 6 (six) hours as needed for wheezing or shortness of breath.   01/26/2023   Aspirin-Caffeine (BC FAST PAIN RELIEF PO) Take 1 Package by mouth daily as needed (pain).   01/26/2023   beclomethasone (QVAR) 40 MCG/ACT inhaler Inhale into the lungs.      gabapentin (NEURONTIN) 300 MG capsule Take by mouth See admin instructions. Take 1 capsule by mouth 3 times a day x 4 days, then take 1 capsule at bedtime x 4 days, take 1 capsule three times daily until finished      hydrochlorothiazide (HYDRODIURIL) 25 MG tablet Take 25 mg by mouth daily.   01/26/2023   olmesartan (BENICAR) 5 MG tablet Take 5 mg by mouth daily.   01/26/2023   meloxicam (MOBIC) 7.5 MG tablet Take 1 tablet (7.5 mg total) by mouth daily. (Patient not taking: Reported on 01/26/2023) 30 tablet 0 Not Taking   predniSONE (DELTASONE) 20 MG tablet Take 2 tablets (40 mg total) by mouth daily. (Patient not taking: Reported on 01/26/2023) 10 tablet 0 Completed Course    Assessment: Pharmacy consulted to  dose heparin in patient with chest pain/ACS.  Patient is not on anticoagulation prior to admission.  Heparin level 0.13 (subtherapeutic) on infusion at 1000 units/hr. This lab is not showing up in EPIC but it is on the Rx protocol reports. D/w lab and they are having trouble releasing but they confirmed it is 0.13. No issues with line or bleeding reported per RN.  Goal of Therapy:  Heparin level 0.3-0.7 units/ml Monitor platelets by anticoagulation protocol: Yes   Plan:  Rebolus heparin 2000 units Increase heparin infusion to 1250 units/hr F/u 6hr heparin level  Christoper Fabian, PharmD, BCPS Please see amion for complete clinical pharmacist phone list 01/27/2023 1:17 AM  Addendum (0515) Heparin level elevated to 0.85 - however this was drawn only ~2 hr post bolus. Will retime for 0800 as originally scheduled.  Christoper Fabian, PharmD, BCPS Please see amion for complete clinical pharmacist phone list 01/27/2023 5:16 AM

## 2023-01-27 NOTE — Telephone Encounter (Deleted)
   Transition of Care Follow-up Phone Call Request    Patient Name: Ann Delacruz Date of Birth: August 28, 1976 Date of Encounter: 01/27/2023  Primary Care Provider:  Enid Baas, MD Primary Cardiologist:  Marjo Bicker, MD  [name] has been scheduled for a transition of care follow up appointment with a [facility] provider: Message has been sent to office staff to reach out to patient to get scheduled with Dr. Swaziland.    Please reach out to [facility] within 48 hours to confirm appointment.  Abagail Kitchens, PA-C  01/27/2023, 2:23 PM

## 2023-01-29 LAB — LIPOPROTEIN A (LPA): Lipoprotein (a): 140.8 nmol/L — ABNORMAL HIGH (ref <75.0)

## 2023-01-30 ENCOUNTER — Encounter (HOSPITAL_COMMUNITY): Payer: Self-pay | Admitting: Internal Medicine

## 2023-01-30 ENCOUNTER — Encounter: Payer: Self-pay | Admitting: Internal Medicine

## 2023-01-30 ENCOUNTER — Encounter: Payer: Self-pay | Admitting: Cardiology

## 2023-01-30 ENCOUNTER — Telehealth: Payer: Self-pay | Admitting: *Deleted

## 2023-01-30 NOTE — Telephone Encounter (Signed)
Patient wanted the letter to return to work RN contacted [employer] PA. He stated letter was  completed.  Sent letter via Earleen Reaper  Patient aware

## 2023-01-30 NOTE — Telephone Encounter (Signed)
Patient contacted regarding discharge from [facility] on 01/27/23.  Patient understands to follow up with provider Philis Nettle on 02/10/23 at 2:30 pm at [facility]. Patient understands discharge instructions? yes  Patient understands medications and regiment? yes  Patient understands to bring all medications to this visit? yes

## 2023-02-10 ENCOUNTER — Encounter: Payer: Self-pay | Admitting: Nurse Practitioner

## 2023-02-10 ENCOUNTER — Ambulatory Visit: Payer: Self-pay | Attending: Nurse Practitioner

## 2023-02-10 ENCOUNTER — Other Ambulatory Visit: Payer: Self-pay | Admitting: Nurse Practitioner

## 2023-02-10 ENCOUNTER — Ambulatory Visit: Payer: Self-pay | Attending: Nurse Practitioner | Admitting: Nurse Practitioner

## 2023-02-10 VITALS — BP 146/80 | HR 84 | Ht 66.0 in | Wt 189.9 lb

## 2023-02-10 DIAGNOSIS — Z720 Tobacco use: Secondary | ICD-10-CM

## 2023-02-10 DIAGNOSIS — F439 Reaction to severe stress, unspecified: Secondary | ICD-10-CM

## 2023-02-10 DIAGNOSIS — I10 Essential (primary) hypertension: Secondary | ICD-10-CM

## 2023-02-10 DIAGNOSIS — I201 Angina pectoris with documented spasm: Secondary | ICD-10-CM

## 2023-02-10 DIAGNOSIS — I2510 Atherosclerotic heart disease of native coronary artery without angina pectoris: Secondary | ICD-10-CM

## 2023-02-10 DIAGNOSIS — R002 Palpitations: Secondary | ICD-10-CM

## 2023-02-10 DIAGNOSIS — E785 Hyperlipidemia, unspecified: Secondary | ICD-10-CM

## 2023-02-10 DIAGNOSIS — R42 Dizziness and giddiness: Secondary | ICD-10-CM

## 2023-02-10 DIAGNOSIS — Z79899 Other long term (current) drug therapy: Secondary | ICD-10-CM

## 2023-02-10 NOTE — Patient Instructions (Addendum)
Medication Instructions:  Your physician recommends that you continue on your current medications as directed. Please refer to the Current Medication list given to you today.   Labwork: CBC and BMET in 2-3 weeks at [facility]  Testing/Procedures: Your physician has recommended that you wear a Zio monitor.   This monitor is a medical device that records the heart's electrical activity. Doctors most often use these monitors to diagnose arrhythmias. Arrhythmias are problems with the speed or rhythm of the heartbeat. The monitor is a small device applied to your chest. You can wear one while you do your normal daily activities. While wearing this monitor if you have any symptoms to push the button and record what you felt. Once you have worn this monitor for the period of time provider prescribed (for * days), you will return the monitor device in the postage paid box. Once it is returned they will download the data collected and provide [place] with a report which the provider will then review and we will call you with those results. Important tips:  Avoid showering during the first 48 hours of wearing the monitor. Avoid excessive sweating to help maximize wear time. Do not submerge the device, no hot tubs, and no swimming pools. Keep any lotions or oils away from the patch. After 48 hours you may shower with the patch on. Take brief showers with your back facing the shower head.  Do not remove patch once it has been placed because that will interrupt data and decrease adhesive wear time. Push the button when you have any symptoms and write down what you were feeling. Once you have completed wearing your monitor, remove and place into box which has postage paid and place in your outgoing mailbox.  If for some reason you have misplaced your box then call our office and we can provide another box and/or mail it off for you.   Follow-Up: Your physician recommends that you schedule a  follow-up appointment in: 8 weeks  Any Other Special Instructions Will Be Listed Below (If Applicable).  If you need a refill on your cardiac medications before your next appointment, please call your pharmacy.

## 2023-02-10 NOTE — Progress Notes (Unsigned)
Office Visit    Patient Name: Ann Delacruz Date of Encounter: 02/10/2023  PCP:  Enid Baas, MD   [facility]  Cardiologist:  Marjo Bicker, MD *** Advanced Practice Provider:  No care team member to display Electrophysiologist:  None  {Press F2 to show EP APP, CHF, sleep or structural heart MD               :[id number]}  { Click here to update then REFRESH NOTE - MD (PCP) or APP (Team Member)  Change PCP Type for MD, Specialty for APP is either Cardiology or Clinical Cardiac Electrophysiology  :[id number]}  Chief Complaint    Ann Delacruz is a 47 y.o. female with a hx of CAD, HTN, prediabetes, HLD, and hx of tobacco abuse, who presents today for  s/p cath follow-up.  Past Medical History    Past Medical History:  Diagnosis Date   Asthma    Hypertension    Past Surgical History:  Procedure Laterality Date   arm surgery     CESAREAN SECTION     LEFT HEART CATH AND CORONARY ANGIOGRAPHY N/A 01/27/2023   Procedure: LEFT HEART CATH AND CORONARY ANGIOGRAPHY;  Surgeon: Orbie Pyo, MD;  Location: [facility] INVASIVE CV [facility];  Service: Cardiovascular;  Laterality: N/A;   LEG SURGERY      Allergies  Allergies  Allergen Reactions   Penicillins     Childhood allergy    History of Present Illness    Ann Delacruz is a 47 y.o. female with a PMH as mentioned above.  Admitted May 2024 due to chest pain.  EKG was negative for acute ischemic changes.  Troponins went from 147-343.  Transferred [facility] and underwent cardiac catheterization due to concerns of NSTEMI.  Cardiac cath showed 30% stenosis in distal circumflex and 10% in RCA.  Had very mild obstructive CAD.  Additionally she had induced spasm RCA once catheter was inserted, no evidence of Takotsubo.  Dr. [place] suspected that initial event may have been due to coronary vasospasm.  Was started on Imdur 30 mg daily, statin added.  HCTZ was discontinued due to  profound hypokalemia.  Today she presents for hospital follow-up.  She states  SH: Works at [employer] Studies Reviewed:   The following studies were reviewed today: ***  EKG:  EKG is *** ordered today.  The ekg ordered today demonstrates ***  Recent Labs: 01/26/2023: ALT 39; Hemoglobin 14.8; Platelets 278 01/27/2023: BUN 12; Creatinine, Ser 0.68; Magnesium 2.2; Potassium 3.2; Sodium 137  Recent Lipid Panel    Component Value Date/Time   CHOL 223 (H) 01/27/2023 0413   TRIG 172 (H) 01/27/2023 0413   HDL 45 01/27/2023 0413   CHOLHDL 5.0 01/27/2023 0413   VLDL 34 01/27/2023 0413   LDLCALC 144 (H) 01/27/2023 0413    Risk Assessment/Calculations:  {Does this patient have ATRIAL FIBRILLATION?[phone]}  Home Medications   Current Meds  Medication Sig   albuterol (PROVENTIL HFA;VENTOLIN HFA) 108 (90 BASE) MCG/ACT inhaler Inhale 2 puffs into the lungs every 6 (six) hours as needed for wheezing or shortness of breath.   aspirin EC 81 MG tablet Take 1 tablet (81 mg total) by mouth daily. Swallow whole.   Aspirin-Caffeine (BC FAST PAIN RELIEF PO) Take 1 Package by mouth daily as needed (pain).   gabapentin (NEURONTIN) 300 MG capsule Take by mouth See admin instructions. Take 1 capsule by mouth 3 times a day x 4 days,  then take 1 capsule at bedtime x 4 days, take 1 capsule three times daily until finished   isosorbide mononitrate (IMDUR) 30 MG 24 hr tablet Take 1 tablet (30 mg total) by mouth daily.   olmesartan (BENICAR) 5 MG tablet Take 5 mg by mouth daily.   rosuvastatin (CRESTOR) 20 MG tablet Take 1 tablet (20 mg total) by mouth daily.     Review of Systems   ***   All other systems reviewed and are otherwise negative except as noted above.  Physical Exam    VS:  BP (!) 146/80 (BP Location: Right Arm, Cuff Size: Normal)   Pulse 84   Ht 5\' 6"  (1.676 m)   Wt 189 lb 14.4 oz (86.1 kg)   SpO2 97%   BMI 30.65 kg/m  , BMI Body mass index is 30.65  kg/m.  Wt Readings from Last 3 Encounters:  02/10/23 189 lb 14.4 oz (86.1 kg)  01/26/23 189 lb (85.7 kg)  09/12/22 203 lb 0.7 oz (92.1 kg)     GEN: Well nourished, well developed, in no acute distress. HEENT: normal. Neck: Supple, no JVD, carotid bruits, or masses. Cardiac: ***RRR, no murmurs, rubs, or gallops. No clubbing, cyanosis, edema.  ***Radials/PT 2+ and equal bilaterally.  Respiratory:  ***Respirations regular and unlabored, clear to auscultation bilaterally. GI: Soft, nontender, nondistended. MS: No deformity or atrophy. Skin: Warm and dry, no rash. Neuro:  Strength and sensation are intact. Psych: Normal affect.  Assessment & Plan    ***  {Are you ordering a CV Procedure (e.g. stress test, cath, DCCV, TEE, etc)?   Press F2        :[id number]}      Disposition: Follow up {follow up:15908} with Vishnu P Mallipeddi, MD or APP.  Signed, Sharlene Dory, NP 02/10/2023, 2:35 PM [facility]

## 2023-02-17 ENCOUNTER — Ambulatory Visit (INDEPENDENT_AMBULATORY_CARE_PROVIDER_SITE_OTHER): Payer: Self-pay

## 2023-02-17 ENCOUNTER — Ambulatory Visit
Admission: EM | Admit: 2023-02-17 | Discharge: 2023-02-17 | Disposition: A | Discharge status: Home / Self Care | Payer: Self-pay | Attending: Physician Assistant | Admitting: Physician Assistant

## 2023-02-17 DIAGNOSIS — S93402A Sprain of unspecified ligament of left ankle, initial encounter: Secondary | ICD-10-CM

## 2023-02-17 DIAGNOSIS — S99912A Unspecified injury of left ankle, initial encounter: Secondary | ICD-10-CM

## 2023-02-17 NOTE — ED Provider Notes (Signed)
[facility]    CSN: [id number] Arrival date & time: 02/17/23  0808      History   Chief Complaint Chief Complaint  Patient presents with   Ankle Injury    HPI Ann Delacruz is a 47 y.o. female presenting for left ankle pain following injury that occurred yesterday.  Patient says she stepped in a hole and twisted her ankle.  She reports pain of the medial and lateral ankle.  Increased pain when bearing weight.  States she is able to put weight on her heel.  History of fracture of the fifth metatarsal bone on the left foot 5 months ago.  She says she has slight tenderness in the area of the previous fracture but denies any significant pain.  No numbness, weakness or tingling.  Patient has taken Tylenol and used ice.  HPI  Past Medical History:  Diagnosis Date   Asthma    Hypertension     Patient Active Problem List   Diagnosis Date Noted   Tobacco abuse 01/27/2023   Chest pain 01/27/2023   Hyperlipidemia 01/27/2023   Hypertension 01/27/2023   Substance induced mood disorder (HCC) 03/01/2016   Alcohol abuse 03/01/2016   Suicidal ideation 03/01/2016    Past Surgical History:  Procedure Laterality Date   arm surgery     CESAREAN SECTION     LEFT HEART CATH AND CORONARY ANGIOGRAPHY N/A 01/27/2023   Procedure: LEFT HEART CATH AND CORONARY ANGIOGRAPHY;  Surgeon: Orbie Pyo, MD;  Location: [facility];  Service: Cardiovascular;  Laterality: N/A;   LEG SURGERY      OB History   No obstetric history on file.      Home Medications    Prior to Admission medications   Medication Sig Start Date End Date Taking? Authorizing Provider  albuterol (PROVENTIL HFA;VENTOLIN HFA) 108 (90 BASE) MCG/ACT inhaler Inhale 2 puffs into the lungs every 6 (six) hours as needed for wheezing or shortness of breath.   Yes [provider]  aspirin EC 81 MG tablet Take 1 tablet (81 mg total) by mouth daily. Swallow whole. 01/28/23  Yes Haley, Sheng L, PA-C   Aspirin-Caffeine (BC FAST PAIN RELIEF PO) Take 1 Package by mouth daily as needed (pain).   Yes [provider]  beclomethasone (QVAR) 40 MCG/ACT inhaler Inhale into the lungs. 09/06/22  Yes [provider]  gabapentin (NEURONTIN) 300 MG capsule Take by mouth See admin instructions. Take 1 capsule by mouth 3 times a day x 4 days, then take 1 capsule at bedtime x 4 days, take 1 capsule three times daily until finished 12/11/19  Yes [provider]  isosorbide mononitrate (IMDUR) 30 MG 24 hr tablet Take 1 tablet (30 mg total) by mouth daily. 01/27/23  Yes Yvonna Alanis L, PA-C  olmesartan (BENICAR) 5 MG tablet Take 5 mg by mouth daily.   Yes [provider]  rosuvastatin (CRESTOR) 20 MG tablet Take 1 tablet (20 mg total) by mouth daily. 01/28/23  Yes Abagail Kitchens, PA-C    Family History History reviewed. No pertinent family history.  Social History Social History   Tobacco Use   Smoking status: Every Day    Packs/day: 2    Types: Cigarettes   Smokeless tobacco: Never  Vaping Use   Vaping Use: Never used  Substance Use Topics   Alcohol use: Not Currently    Comment: occasionally   Drug use: No     Allergies   Penicillins  Review of Systems Review of Systems  Musculoskeletal:  Positive for arthralgias and joint swelling. Negative for gait problem.  Skin:  Negative for color change and wound.  Neurological:  Negative for weakness and numbness.     Physical Exam Triage Vital Signs ED Triage Vitals  Enc Vitals Group     BP      Pulse      Resp      Temp      Temp src      SpO2      Weight      Height      Head Circumference      Peak Flow      Pain Score      Pain Loc      Pain Edu?      Excl. in GC?    No data found.  Updated Vital Signs BP 108/73 (BP Location: Left Arm)   Pulse 78   Temp 97.8 F (36.6 C) (Oral)   Resp 16   Ht 5\' 6"  (1.676 m)   Wt 189 lb (85.7 kg)   LMP 02/16/2022   SpO2 95%   BMI 30.51 kg/m        Physical Exam Vitals and nursing note reviewed.  Constitutional:      General: She is not in acute distress.    Appearance: Normal appearance. She is not ill-appearing or toxic-appearing.  HENT:     Head: Normocephalic and atraumatic.  Eyes:     General: No scleral icterus.       Right eye: No discharge.        Left eye: No discharge.     Conjunctiva/sclera: Conjunctivae normal.  Cardiovascular:     Rate and Rhythm: Normal rate and regular rhythm.     Pulses: Normal pulses.  Pulmonary:     Effort: Pulmonary effort is normal. No respiratory distress.  Musculoskeletal:     Cervical back: Neck supple.     Left ankle: Swelling present. No deformity or ecchymosis. Tenderness present over the lateral malleolus, medial malleolus, ATF ligament and base of 5th metatarsal. Decreased range of motion.  Skin:    General: Skin is dry.  Neurological:     General: No focal deficit present.     Mental Status: She is alert. Mental status is at baseline.     Motor: No weakness.     Gait: Gait abnormal.  Psychiatric:        Mood and Affect: Mood normal.        Behavior: Behavior normal.        Thought Content: Thought content normal.      UC Treatments / Results  Labs (all labs ordered are listed, but only abnormal results are displayed) Labs Reviewed - No data to display  EKG   Radiology DG Ankle Complete Left  Result Date: 02/17/2023 CLINICAL DATA:  Fracture of fifth metatarsal 6 months ago. No injury. Left ankle injury yesterday. Stepped in a hole in her yard. EXAM: LEFT ANKLE COMPLETE - 3+ VIEW; LEFT FOOT - COMPLETE 3+ VIEW COMPARISON:  Left foot radiographs 09/12/2022, left ankle radiographs 09/23/2016 FINDINGS: Left ankle: The ankle mortise is symmetric and intact. Unchanged remote curvilinear ossicle just dorsal to the talar neck, likely the sequela of remote trauma. Joint spaces are preserved. No acute fracture or dislocation. Left foot: Interval complete healing of the  previously seen oblique fracture of the mid to distal shaft of the fifth metatarsal, with unchanged mild medial displacement  of the distal fracture component with respect to the proximal fracture component. No acute fracture is seen. Joint spaces are preserved. IMPRESSION: 1. No acute fracture. 2. Interval complete healing of the previously seen oblique fracture of the mid to distal shaft of the fifth metatarsal. Electronically Signed   By: Neita Garnet M.D.   On: 02/17/2023 09:08   DG Foot Complete Left  Result Date: 02/17/2023 CLINICAL DATA:  Fracture of fifth metatarsal 6 months ago. No injury. Left ankle injury yesterday. Stepped in a hole in her yard. EXAM: LEFT ANKLE COMPLETE - 3+ VIEW; LEFT FOOT - COMPLETE 3+ VIEW COMPARISON:  Left foot radiographs 09/12/2022, left ankle radiographs 09/23/2016 FINDINGS: Left ankle: The ankle mortise is symmetric and intact. Unchanged remote curvilinear ossicle just dorsal to the talar neck, likely the sequela of remote trauma. Joint spaces are preserved. No acute fracture or dislocation. Left foot: Interval complete healing of the previously seen oblique fracture of the mid to distal shaft of the fifth metatarsal, with unchanged mild medial displacement of the distal fracture component with respect to the proximal fracture component. No acute fracture is seen. Joint spaces are preserved. IMPRESSION: 1. No acute fracture. 2. Interval complete healing of the previously seen oblique fracture of the mid to distal shaft of the fifth metatarsal. Electronically Signed   By: Neita Garnet M.D.   On: 02/17/2023 09:08    Procedures Procedures (including critical care time)  Medications Ordered in UC Medications - No data to display  Initial Impression / Assessment and Plan / UC Course  I have reviewed the triage vital signs and the nursing notes.  Pertinent labs & imaging results that were available during my care of the patient were reviewed by me and considered in my  medical decision making (see chart for details).   47 year old female presents for left ankle injury.  Previous fracture of the left fifth metatarsal 5 months ago.  X-ray of left ankle and foot obtained.  No acute fractures and the previous fracture is healed.  Reviewed results with patient.  Reviewed RICE guidelines.  Tylenol for pain.  Patient given lace up ankle brace.  Follow-up as needed.   Final Clinical Impressions(s) / UC Diagnoses   Final diagnoses:  Sprain of left ankle, unspecified ligament, initial encounter  Injury of left ankle, initial encounter     Discharge Instructions      SPRAIN: No fractures. Stressed avoiding painful activities . Reviewed RICE guidelines. Use medications as directed, including topical NSAIDs. If no NSAIDs have been prescribed for you today, you may take Aleve or Motrin over the counter unless instructed not to by your cardiologist. Consider topical Voltaren gel. May use Tylenol in between doses of NSAIDs.  If no improvement in the next 1-2 weeks, f/u with PCP or return to our office for reexamination, and please feel free to call or return at any time for any questions or concerns you may have and we will be happy to help you!         ED Prescriptions   None    PDMP not reviewed this encounter.   Shirlee Latch, PA-C 02/17/23 [phone]

## 2023-02-17 NOTE — Discharge Instructions (Signed)
SPRAIN: No fractures. Stressed avoiding painful activities . Reviewed RICE guidelines. Use medications as directed, including topical NSAIDs. If no NSAIDs have been prescribed for you today, you may take Aleve or Motrin over the counter unless instructed not to by your cardiologist. Consider topical Voltaren gel. May use Tylenol in between doses of NSAIDs.  If no improvement in the next 1-2 weeks, f/u with PCP or return to our office for reexamination, and please feel free to call or return at any time for any questions or concerns you may have and we will be happy to help you!

## 2023-02-17 NOTE — ED Triage Notes (Signed)
Pt c/o left ankle injury x1day.  Pt states that she stepped in a hole on her yard yesterday evening.  Pt states that she has been using ice and has minor swelling and bruising.  Pt states that she broke her left foot last December.   Pt states that the pain has gotten worse over night.

## 2023-02-21 ENCOUNTER — Telehealth: Payer: Self-pay | Admitting: Nurse Practitioner

## 2023-02-21 DIAGNOSIS — Z0279 Encounter for issue of other medical certificate: Secondary | ICD-10-CM

## 2023-02-21 NOTE — Telephone Encounter (Signed)
Received pt's FMLA forms from fax-   Pt came to office and signed release and information sheet Paid $29 fee with cash  Forms given to [facility]

## 2023-02-28 ENCOUNTER — Other Ambulatory Visit (HOSPITAL_COMMUNITY): Payer: Self-pay

## 2023-03-01 ENCOUNTER — Other Ambulatory Visit
Admission: RE | Admit: 2023-03-01 | Discharge: 2023-03-01 | Disposition: A | Discharge status: Home / Self Care | Payer: Self-pay | Source: Ambulatory Visit | Attending: Nurse Practitioner | Admitting: Nurse Practitioner

## 2023-03-01 DIAGNOSIS — Z79899 Other long term (current) drug therapy: Secondary | ICD-10-CM | POA: Insufficient documentation

## 2023-03-01 DIAGNOSIS — E876 Hypokalemia: Secondary | ICD-10-CM | POA: Insufficient documentation

## 2023-03-01 LAB — BASIC METABOLIC PANEL
Anion gap: 10 (ref 5–15)
BUN: 22 mg/dL — ABNORMAL HIGH (ref 6–20)
CO2: 28 mmol/L (ref 22–32)
Calcium: 8.7 mg/dL — ABNORMAL LOW (ref 8.9–10.3)
Chloride: 103 mmol/L (ref 98–111)
Creatinine, Ser: 0.73 mg/dL (ref 0.44–1.00)
GFR, Estimated: >60 mL/min (ref >60)
Glucose, Bld: 96 mg/dL (ref 70–99)
Potassium: 4.5 mmol/L (ref 3.5–5.1)
Sodium: 141 mmol/L (ref 135–145)

## 2023-03-01 LAB — CBC
HCT: 46.6 % — ABNORMAL HIGH (ref 36.0–46.0)
Hemoglobin: 15.1 g/dL — ABNORMAL HIGH (ref 12.0–15.0)
MCH: 33.4 pg (ref 26.0–34.0)
MCHC: 32.4 g/dL (ref 30.0–36.0)
MCV: 103.1 fL — ABNORMAL HIGH (ref 80.0–100.0)
Platelets: 271 10*3/uL (ref 150–400)
RBC: 4.52 MIL/uL (ref 3.87–5.11)
RDW: 14 % (ref 11.5–15.5)
WBC: 17.1 10*3/uL — ABNORMAL HIGH (ref 4.0–10.5)
nRBC: 0 % (ref 0.0–0.2)

## 2023-03-01 NOTE — Addendum Note (Signed)
Addended by: Lonell Face C on: 03/01/2023 09:31 AM   Modules accepted: Orders

## 2023-04-13 ENCOUNTER — Encounter: Payer: Self-pay | Admitting: Nurse Practitioner

## 2023-04-13 ENCOUNTER — Ambulatory Visit: Payer: Self-pay | Attending: Nurse Practitioner | Admitting: Nurse Practitioner

## 2023-04-13 VITALS — BP 130/72 | HR 93 | Ht 66.0 in | Wt 195.8 lb

## 2023-04-13 DIAGNOSIS — E785 Hyperlipidemia, unspecified: Secondary | ICD-10-CM

## 2023-04-13 DIAGNOSIS — I10 Essential (primary) hypertension: Secondary | ICD-10-CM

## 2023-04-13 DIAGNOSIS — R42 Dizziness and giddiness: Secondary | ICD-10-CM

## 2023-04-13 DIAGNOSIS — I2510 Atherosclerotic heart disease of native coronary artery without angina pectoris: Secondary | ICD-10-CM

## 2023-04-13 DIAGNOSIS — Z720 Tobacco use: Secondary | ICD-10-CM

## 2023-04-13 DIAGNOSIS — I201 Angina pectoris with documented spasm: Secondary | ICD-10-CM

## 2023-04-13 DIAGNOSIS — R002 Palpitations: Secondary | ICD-10-CM

## 2023-04-13 MED ORDER — ISOSORBIDE MONONITRATE ER 30 MG PO TB24: 15.0000 mg | ORAL_TABLET | Freq: Every day | ORAL | 3 refills | Status: DC

## 2023-04-13 NOTE — Patient Instructions (Addendum)
Medication Instructions:  Your physician has recommended you make the following change in your medication:  Reduce IMDUR to 15 Mg daily  Continue all other medications as prescribed   Labwork: None  Testing/Procedures: None  Follow-Up: Your physician recommends that you schedule a follow-up appointment in: 6-8 Weeks with Philis Nettle   Any Other Special Instructions Will Be Listed Below (If Applicable).  If you need a refill on your cardiac medications before your next appointment, please call your pharmacy.

## 2023-04-13 NOTE — Progress Notes (Signed)
Office Visit    Patient Name: Ann Delacruz Date of Encounter: 04/13/2023 PCP:  No primary care provider on file. [facility]  Cardiologist:  Marjo Bicker, MD  Advanced Practice Provider:  No care team member to display Electrophysiologist:  None   Chief Complaint    Ann Delacruz is a 47 y.o. female with a hx of CAD, HTN, prediabetes, HLD, asthma, and hx of tobacco abuse, who presents today for follow-up.  Admitted May 2024 due to chest pain.  EKG was negative.  Troponins went from 147-343.  Underwent LHC that showed 30% stenosis in distal circumflex and 10% in RCA.  Had very mild obstructive CAD.  Additionally she had induced spasm RCA once catheter was inserted, no evidence of Takotsubo.  Dr. [place] suspected that initial event may have been due to coronary vasospasm.  Was started on Imdur 30 mg daily, statin added.  HCTZ was discontinued due to profound hypokalemia.  I last saw her for hospital follow-up on Feb 10, 2023.  Denied any recurrent chest pain, but admitted to occasional "flutter" sensation along left upper anterior chest.  Chief concern was dizziness, occurred with squatting or sitting. Admitted to significant stress and tearful during interview as she told me she was working overtime as Production designer, theatre/television/film at [employer], caregiver to husband who was recently diagnosed with dementia. Had recently quit drinking. Had been cutting back her tobacco use to 1 pack/day.  Today she presents for follow-up. Continues to note palpitations and dizziness. Dizziness is more bothersome for her, typically noticed when bending over. Denies any chest pain, shortness of breath, syncope, presyncope, orthopnea, PND, swelling or significant weight changes, acute bleeding, or claudication.  SH: Works as Art therapist at [employer] Studies Reviewed:   The following studies were reviewed today:   EKG:  EKG is not ordered today.    Echocardiogram 01/27/2023:  1. Left ventricular ejection fraction, by estimation, is 60 to 65%. The  left ventricle has normal function. The left ventricle has no regional  wall motion abnormalities. Left ventricular diastolic parameters were  normal.   2. Right ventricular systolic function is normal. The right ventricular  size is normal.   3. The mitral valve is normal in structure. Trivial mitral valve  regurgitation. No evidence of mitral stenosis.   4. The aortic valve is normal in structure. Aortic valve regurgitation is  not visualized. No aortic stenosis is present.   5. The inferior vena cava is normal in size with greater than 50%  respiratory variability, suggesting right atrial pressure of 3 mmHg.  LHC 01/27/2023:   Dist Cx lesion is 30% stenosed.   Ost RCA to Prox RCA lesion is 10% stenosed.   1.  Very mild obstructive coronary artery disease. 2.  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 3.  LVEDP of 8 mmHg with preserved LV function with no evidence of Takotsubo cardiomyopathy.   Recommendation: Medical therapy.  Review of Systems    All other systems reviewed and are otherwise negative except as noted above.  Physical Exam    VS:  BP 130/72   Pulse 93   Ht 5\' 6"  (1.676 m)   Wt 195 lb 12.8 oz (88.8 kg)   SpO2 94%   BMI 31.60 kg/m  , BMI Body mass index is 31.6 kg/m.  Wt Readings from Last 3 Encounters:  04/13/23 195 lb 12.8 oz (88.8 kg)  02/17/23 189 lb (  85.7 kg)  02/10/23 189 lb 14.4 oz (86.1 kg)   04/13/2023 Orthostatics vital signs today: Lying -118/72, 88 Sitting-130/78, 96 Standing-125/71, 101   GEN: Obese, 47 year old female in no acute distress. HEENT: normal. Neck: Supple, no JVD, carotid bruits, or masses. Cardiac:S1/S2, RRR, no murmurs, rubs, or gallops. No clubbing, cyanosis, edema.  Radials/PT 2+ and equal bilaterally.  Respiratory:  Respirations regular and unlabored, clear to  auscultation bilaterally. MS: No deformity or atrophy. Skin: Warm and dry, no rash. Neuro:  Strength and sensation are intact. Psych: Calm, pleasant  Assessment & Plan    CAD, coronary vasospasm Stable with no anginal symptoms. No indication for ischemic evaluation.  LHC 01/2023 revealed very mild obstructive CAD with evidence of catheter induced spasm of right coronary artery, medical therapy recommended.  Continue aspirin, olmesartan, and rosuvastatin. Will reduce Imdur to 15 mg daily to improve dizziness - see below. Heart healthy diet and regular cardiovascular exercise encouraged.    Palpitations, dizziness Does admit to occasional palpitations and dizziness with bending over, negative orthostatics on exam today.  Denies any sustained palpitations or tachycardia.  She states dizziness is not related to her blood pressure. Previous 7 day ZIO monitor results are pending. Will reduce Imdur to 15 mg daily to see if this improves her symptoms. Does have cervical spondylosis, told her I recommend she follow-up with her Neurologist - she verbalized understanding. Conservative measures discussed.  Heart healthy diet and regular cardiovascular exercise encouraged.  ED precautions discussed.  Hypertension Blood pressure 130/72.  Discussed SBP goal is less than 130.  She states at home blood pressures are well-controlled. Discussed to monitor BP at home at least 2 hours after medications and sitting for 5-10 minutes. Reducing Imdur as mentioned above. No medication changes at this time. She will call and let us know if SBP remains > 130. Heart healthy diet and regular cardiovascular exercise encouraged.   Hyperlipidemia Past lipid panel showed LDL of 144.  Was started on rosuvastatin and tolerating well.  Continue rosuvastatin.  Will obtain FLP and LFT. Heart healthy diet and regular cardiovascular exercise encouraged.   Tobacco abuse Smoking cessation encouraged and discussed.  Disposition: Follow  up in 6-8 weeks with Vishnu P Mallipeddi, MD or APP.  Signed, Sharlene Dory, NP 04/16/2023, 6:11 AM [facility]

## 2023-06-08 ENCOUNTER — Ambulatory Visit: Payer: Self-pay | Admitting: Nurse Practitioner

## 2023-06-08 NOTE — Progress Notes (Deleted)
Office Visit    Patient Name: Ann Delacruz Date of Encounter: 04/13/2023 PCP:  No primary care provider on file. [facility]  Cardiologist:  Marjo Bicker, MD  Advanced Practice Provider:  No care team member to display Electrophysiologist:  None   Chief Complaint    Ann Delacruz is a 47 y.o. female with a hx of CAD, HTN, prediabetes, HLD, asthma, and hx of tobacco abuse, who presents today for follow-up.  Admitted May 2024 due to chest pain.  EKG was negative.  Troponins went from 147-343.  Underwent LHC that showed 30% stenosis in distal circumflex and 10% in RCA.  Had very mild obstructive CAD.  Additionally she had induced spasm RCA once catheter was inserted, no evidence of Takotsubo.  Dr. [place] suspected that initial event may have been due to coronary vasospasm.  Was started on Imdur 30 mg daily, statin added.  HCTZ was discontinued due to profound hypokalemia.  I last saw her for hospital follow-up on Feb 10, 2023.  Denied any recurrent chest pain, but admitted to occasional "flutter" sensation along left upper anterior chest.  Chief concern was dizziness, occurred with squatting or sitting. Admitted to significant stress and tearful during interview as she told me she was working overtime as Production designer, theatre/television/film at [employer], caregiver to husband who was recently diagnosed with dementia. Had recently quit drinking. Had been cutting back her tobacco use to 1 pack/day.  Today she presents for follow-up. Continues to note palpitations and dizziness. Dizziness is more bothersome for her, typically noticed when bending over. Denies any chest pain, shortness of breath, syncope, presyncope, orthopnea, PND, swelling or significant weight changes, acute bleeding, or claudication.  SH: Works as Art therapist at [employer] Studies Reviewed:   The following studies were reviewed today:   EKG:  EKG is not ordered today.    Echocardiogram 01/27/2023:  1. Left ventricular ejection fraction, by estimation, is 60 to 65%. The  left ventricle has normal function. The left ventricle has no regional  wall motion abnormalities. Left ventricular diastolic parameters were  normal.   2. Right ventricular systolic function is normal. The right ventricular  size is normal.   3. The mitral valve is normal in structure. Trivial mitral valve  regurgitation. No evidence of mitral stenosis.   4. The aortic valve is normal in structure. Aortic valve regurgitation is  not visualized. No aortic stenosis is present.   5. The inferior vena cava is normal in size with greater than 50%  respiratory variability, suggesting right atrial pressure of 3 mmHg.  LHC 01/27/2023:   Dist Cx lesion is 30% stenosed.   Ost RCA to Prox RCA lesion is 10% stenosed.   1.  Very mild obstructive coronary artery disease. 2.  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 3.  LVEDP of 8 mmHg with preserved LV function with no evidence of Takotsubo cardiomyopathy.   Recommendation: Medical therapy.  Review of Systems    All other systems reviewed and are otherwise negative except as noted above.  Physical Exam    VS:  There were no vitals taken for this visit. , BMI There is no height or weight on file to calculate BMI.  Wt Readings from Last 3 Encounters:  04/13/23 195 lb 12.8 oz (88.8 kg)  02/17/23 189 lb (85.7 kg)  02/10/23 189 lb 14.4 oz (86.1 kg)   04/13/2023 Orthostatics vital signs today: Lying -118/72,  88 Sitting-130/78, 96 Standing-125/71, 101   GEN: Obese, 47 year old female in no acute distress. HEENT: normal. Neck: Supple, no JVD, carotid bruits, or masses. Cardiac:S1/S2, RRR, no murmurs, rubs, or gallops. No clubbing, cyanosis, edema.  Radials/PT 2+ and equal bilaterally.  Respiratory:  Respirations regular and unlabored, clear to auscultation bilaterally. MS: No deformity  or atrophy. Skin: Warm and dry, no rash. Neuro:  Strength and sensation are intact. Psych: Calm, pleasant  Assessment & Plan    CAD, coronary vasospasm Stable with no anginal symptoms. No indication for ischemic evaluation.  LHC 01/2023 revealed very mild obstructive CAD with evidence of catheter induced spasm of right coronary artery, medical therapy recommended.  Continue aspirin, olmesartan, and rosuvastatin. Will reduce Imdur to 15 mg daily to improve dizziness - see below. Heart healthy diet and regular cardiovascular exercise encouraged.    Palpitations, dizziness Does admit to occasional palpitations and dizziness with bending over, negative orthostatics on exam today.  Denies any sustained palpitations or tachycardia.  She states dizziness is not related to her blood pressure. Previous 7 day ZIO monitor results are pending. Will reduce Imdur to 15 mg daily to see if this improves her symptoms. Does have cervical spondylosis, told her I recommend she follow-up with her Neurologist - she verbalized understanding. Conservative measures discussed.  Heart healthy diet and regular cardiovascular exercise encouraged.  ED precautions discussed.  Hypertension Blood pressure 130/72.  Discussed SBP goal is less than 130.  She states at home blood pressures are well-controlled. Discussed to monitor BP at home at least 2 hours after medications and sitting for 5-10 minutes. Reducing Imdur as mentioned above. No medication changes at this time. She will call and let us know if SBP remains > 130. Heart healthy diet and regular cardiovascular exercise encouraged.   Hyperlipidemia Past lipid panel showed LDL of 144.  Was started on rosuvastatin and tolerating well.  Continue rosuvastatin.  Will obtain FLP and LFT. Heart healthy diet and regular cardiovascular exercise encouraged.   Tobacco abuse Smoking cessation encouraged and discussed.  Disposition: Follow up in 6-8 weeks with Vishnu P Mallipeddi, MD  or APP.  Signed, Sharlene Dory, NP 06/08/2023, 8:17 AM [facility]

## 2023-08-15 ENCOUNTER — Other Ambulatory Visit (HOSPITAL_BASED_OUTPATIENT_CLINIC_OR_DEPARTMENT_OTHER): Payer: Self-pay

## 2023-11-20 ENCOUNTER — Encounter (HOSPITAL_COMMUNITY): Payer: Self-pay

## 2023-11-20 ENCOUNTER — Observation Stay (HOSPITAL_COMMUNITY)
Admission: EM | Admit: 2023-11-20 | Discharge: 2023-11-20 | Discharge status: Against Medical Advice | Payer: Self-pay | Attending: Internal Medicine | Admitting: Internal Medicine

## 2023-11-20 ENCOUNTER — Encounter: Payer: Self-pay | Admitting: Student

## 2023-11-20 ENCOUNTER — Emergency Department (HOSPITAL_COMMUNITY): Payer: Self-pay

## 2023-11-20 DIAGNOSIS — E785 Hyperlipidemia, unspecified: Secondary | ICD-10-CM | POA: Diagnosis not present

## 2023-11-20 DIAGNOSIS — I10 Essential (primary) hypertension: Secondary | ICD-10-CM | POA: Diagnosis present

## 2023-11-20 DIAGNOSIS — R7303 Prediabetes: Secondary | ICD-10-CM | POA: Insufficient documentation

## 2023-11-20 DIAGNOSIS — R0789 Other chest pain: Secondary | ICD-10-CM | POA: Diagnosis not present

## 2023-11-20 DIAGNOSIS — R002 Palpitations: Secondary | ICD-10-CM | POA: Diagnosis not present

## 2023-11-20 DIAGNOSIS — R000 Tachycardia, unspecified: Secondary | ICD-10-CM | POA: Diagnosis not present

## 2023-11-20 DIAGNOSIS — Z720 Tobacco use: Secondary | ICD-10-CM | POA: Diagnosis present

## 2023-11-20 DIAGNOSIS — R1013 Epigastric pain: Secondary | ICD-10-CM | POA: Diagnosis not present

## 2023-11-20 DIAGNOSIS — F1010 Alcohol abuse, uncomplicated: Secondary | ICD-10-CM | POA: Diagnosis not present

## 2023-11-20 DIAGNOSIS — J45909 Unspecified asthma, uncomplicated: Secondary | ICD-10-CM | POA: Insufficient documentation

## 2023-11-20 DIAGNOSIS — F17210 Nicotine dependence, cigarettes, uncomplicated: Secondary | ICD-10-CM | POA: Diagnosis not present

## 2023-11-20 DIAGNOSIS — Z7982 Long term (current) use of aspirin: Secondary | ICD-10-CM | POA: Diagnosis not present

## 2023-11-20 DIAGNOSIS — R0602 Shortness of breath: Principal | ICD-10-CM

## 2023-11-20 DIAGNOSIS — Z79899 Other long term (current) drug therapy: Secondary | ICD-10-CM | POA: Insufficient documentation

## 2023-11-20 DIAGNOSIS — R079 Chest pain, unspecified: Secondary | ICD-10-CM | POA: Diagnosis present

## 2023-11-20 LAB — COMPREHENSIVE METABOLIC PANEL
ALT: 24 U/L (ref 0–44)
AST: 28 U/L (ref 15–41)
Albumin: 3.5 g/dL (ref 3.5–5.0)
Alkaline Phosphatase: 64 U/L (ref 38–126)
Anion gap: 8 (ref 5–15)
BUN: 18 mg/dL (ref 6–20)
CO2: 27 mmol/L (ref 22–32)
Calcium: 8.7 mg/dL — ABNORMAL LOW (ref 8.9–10.3)
Chloride: 104 mmol/L (ref 98–111)
Creatinine, Ser: 0.73 mg/dL (ref 0.44–1.00)
GFR, Estimated: >60 mL/min (ref >60)
Glucose, Bld: 112 mg/dL — ABNORMAL HIGH (ref 70–99)
Potassium: 4 mmol/L (ref 3.5–5.1)
Sodium: 139 mmol/L (ref 135–145)
Total Bilirubin: 0.7 mg/dL (ref 0.0–1.2)
Total Protein: 6.8 g/dL (ref 6.5–8.1)

## 2023-11-20 LAB — TROPONIN I (HIGH SENSITIVITY)
Troponin I (High Sensitivity): 4 ng/L (ref <18)
Troponin I (High Sensitivity): 6 ng/L (ref <18)

## 2023-11-20 LAB — HCG, SERUM, QUALITATIVE: Preg, Serum: NEGATIVE

## 2023-11-20 LAB — CBC
HCT: 46.2 % — ABNORMAL HIGH (ref 36.0–46.0)
Hemoglobin: 15.3 g/dL — ABNORMAL HIGH (ref 12.0–15.0)
MCH: 34.4 pg — ABNORMAL HIGH (ref 26.0–34.0)
MCHC: 33.1 g/dL (ref 30.0–36.0)
MCV: 103.8 fL — ABNORMAL HIGH (ref 80.0–100.0)
Platelets: 247 10*3/uL (ref 150–400)
RBC: 4.45 MIL/uL (ref 3.87–5.11)
RDW: 14 % (ref 11.5–15.5)
WBC: 14.5 10*3/uL — ABNORMAL HIGH (ref 4.0–10.5)
nRBC: 0 % (ref 0.0–0.2)

## 2023-11-20 LAB — LIPASE, BLOOD: Lipase: 25 U/L (ref 11–51)

## 2023-11-20 MED ORDER — ADULT MULTIVITAMIN W/MINERALS CH: 1.0000 | ORAL_TABLET | Freq: Every day | ORAL | Status: DC

## 2023-11-20 MED ORDER — FOLIC ACID 1 MG PO TABS: 1.0000 mg | ORAL_TABLET | Freq: Every day | ORAL | Status: DC

## 2023-11-20 MED ORDER — ACETAMINOPHEN 325 MG PO TABS: 650.0000 mg | ORAL_TABLET | Freq: Four times a day (QID) | ORAL | Status: DC | PRN

## 2023-11-20 MED ORDER — POLYETHYLENE GLYCOL 3350 17 G PO PACK: 17.0000 g | PACK | Freq: Every day | ORAL | Status: DC | PRN

## 2023-11-20 MED ORDER — NICOTINE 21 MG/24HR TD PT24: 21.0000 mg | MEDICATED_PATCH | Freq: Every day | TRANSDERMAL | Status: DC

## 2023-11-20 MED ORDER — THIAMINE MONONITRATE 100 MG PO TABS: 100.0000 mg | ORAL_TABLET | Freq: Every day | ORAL | Status: DC

## 2023-11-20 MED ORDER — ACETAMINOPHEN 650 MG RE SUPP: 650.0000 mg | Freq: Four times a day (QID) | RECTAL | Status: DC | PRN

## 2023-11-20 NOTE — ED Notes (Signed)
 Pt has eloped prior to MD seeing pt. Pt called/voicemail left asking pt to return for IV removal. MD and charge nurse aware of situation.

## 2023-11-20 NOTE — Discharge Summary (Signed)
   Name: Ann Delacruz MRN: [id number] DOB: May 08, 1976 48 y.o. PCP: Patient, No Pcp Per  Date of Admission: 11/20/2023  8:25 AM Date of Discharge:  Attending Physician: Gust Rung, DO   Patient was discharged Against Medical Advice  Discharge Diagnosis: 1. Chest pain  Discharge Medications: Allergies as of 11/20/2023       Reactions   Penicillins Other (See Comments)   Reaction type/severity unknown. Childhood allergy        Medication List     TAKE these medications    albuterol 108 (90 Base) MCG/ACT inhaler Commonly known as: VENTOLIN HFA Inhale 2 puffs into the lungs every 6 (six) hours as needed for wheezing or shortness of breath.   aspirin EC 81 MG tablet Take 1 tablet (81 mg total) by mouth daily. Swallow whole.   BC FAST PAIN RELIEF PO Take 1 Package by mouth daily as needed (pain).   isosorbide mononitrate 30 MG 24 hr tablet Commonly known as: IMDUR Take 0.5 tablets (15 mg total) by mouth daily.   olmesartan 5 MG tablet Commonly known as: BENICAR Take 5 mg by mouth daily.   rosuvastatin 20 MG tablet Commonly known as: CRESTOR Take 1 tablet (20 mg total) by mouth daily.       Disposition and follow-up:   [name] was discharged from [facility] against medical advice. She was encouraged to follow up with her cardiologist via MyChart. We were notified by the emergency department nurse at 1630 about the patient's desire to leave against medical advice. We went to speak with her, but unfortunately she left the hospital prior to our arrival in the emergency department.  Signed: Annett Fabian, MD Internal Medicine Resident, PGY-1 [facility]  Pager: [phone] 4:36 PM, 11/20/2023   11/20/2023, 4:36 PM

## 2023-11-20 NOTE — ED Triage Notes (Signed)
 PT BIB EMS from work with c/o SOB with increased work of breathing. BP was 212/102 upon EMS arrival. Also c/o R shoulder pain radiating down R arm as well as epigastric pain and palpitations. Hx of NSTEMI last year.

## 2023-11-20 NOTE — ED Notes (Signed)
 Hooked patient back up to the monitor patient is resting with call bell bell in reach

## 2023-11-20 NOTE — ED Notes (Signed)
 Pt wanting to leave/be discharged at this time. MD notified.

## 2023-11-20 NOTE — ED Notes (Signed)
 MD states he is on the way to see patient. Pt willing to sign AMA form.

## 2023-11-20 NOTE — H&P (Signed)
 . Date: 11/20/2023               Patient Name:  Ann Delacruz MRN: [id number]  DOB: 24-Aug-1976 Age / Sex: 48 y.o., female   PCP: Patient, No Pcp Per         Medical Service: Internal Medicine Teaching Service         Attending Physician: Dr. Gust Rung, DO      First Contact: Dr. Annett Fabian, MD Pager [phone]    Second Contact: Dr. Modena Slater, DO Pager [phone]         After Hours (After 5p/  First Contact Pager: [phone]  weekends / holidays): Second Contact Pager: [phone]   SUBJECTIVE   Chief Complaint: chest pain  History of Present Illness: Ann Delacruz is a 48 year old female with past medical history of hypertension, tobacco abuse disorder, alcohol use disorder, prediabetes, hyperlipidemia, and asthma who presents with chest pain.  She notes that her chest pain began this morning around 6 AM as she was try to get ready for work.  She also had some shortness of breath at that time.  She felt that her shortness of breath was limiting her ability to speak.  She also felt some lightheadedness, which persisted even when she sat down to rest.  Given her cardiac history, this concerned her, so she called EMS.  In addition to the above symptoms, she reports chest tightness in the center of her chest and pain on the posterior aspect of her right shoulder, which both began this morning suddenly.  She had some nausea but no vomiting.  No recent illnesses.  No recent injuries to the right shoulder.  No headaches, vision changes.  She does not take any medications regularly other than two arthritis BC's every morning and albuterol as needed.   Of note, she was admitted on 01/26/2023 with elevated troponins and concerns of NSTEMI. During that admission, she underwent through cardiac catheterization, which showed no obstructive CAD (30% stenosis in distal circumflex, 10% RCA). Vasospasm of the RCA from the catheter was observed.  She was discharged on aspirin, statin and imdur  but has not been taking these.   ED Course: Satting well on room air.  EKG with sinus rhythm.  Chest x-ray normal.  Troponins normal x 2.  Received nitro and aspirin via EMS, which improved her pain.  Cardiology called from the emergency department, recommended admission with medicine.  Meds:  Albuterol inhaler- one time a day or once every other day  Aspirin-caffeine- BC arthritis 2 pills daily   Not taking: Imdur 15 mg daily Olmesartan 5 mg daily Rosuvastatin 10 mg daily Aspirin 81 mg daily  Past Medical History: NSTEMI Asthma Tobacco use disorder Alcohol use disorder Prediabetes  Past Surgical History: Past Surgical History:  Procedure Laterality Date   arm surgery     CESAREAN SECTION     LEFT HEART CATH AND CORONARY ANGIOGRAPHY N/A 01/27/2023   Procedure: LEFT HEART CATH AND CORONARY ANGIOGRAPHY;  Surgeon: Orbie Pyo, MD;  Location: [facility]VAS[facility];  Service: Cardiovascular;  Laterality: N/A;   LEG SURGERY     Social:  Lives With: husband, in [place] Occupation: Chief of Staff Support: husband, mother Level of Function: independent PCP: [facility] Substances: 1-2 ppd of cigarettes for 20 years. 6 beers of alcohol nightly. No other drug use.   Family History: Cardiac disease in both parents. No other pertinent family history.   Allergies: Allergies as of 11/20/2023 -  Review Complete 11/20/2023  Allergen Reaction Noted   Penicillins  11/14/2014   Review of Systems: A complete ROS was negative except as per HPI.   OBJECTIVE:   Physical Exam: Blood pressure 131/64, pulse 84, temperature 98.1 F (36.7 C), temperature source Oral, resp. rate (!) 25, height 5\' 6"  (1.676 m), weight 87.1 kg, SpO2 97%.  Constitutional: well-appearing female sitting in bed, in no acute distress HENT: normocephalic atraumatic, mucous membranes moist Eyes: conjunctiva non-erythematous Neck: supple Cardiovascular: regular rate and rhythm, no murmurs,  euvolemic Pulmonary/Chest: normal work of breathing on room air, lungs clear to auscultation bilaterally Abdominal: soft, non-tender, non-distended MSK: normal bulk and tone Neurological: alert & oriented x 3, no focal weakness or neurological deficits Skin: warm and dry; flush skin across chest Psych: Normal mood and affect  Labs: CBC    Component Value Date/Time   WBC 14.5 (H) 11/20/2023 0840   RBC 4.45 11/20/2023 0840   HGB 15.3 (H) 11/20/2023 0840   HGB 14.6 05/05/2014 0900   HCT 46.2 (H) 11/20/2023 0840   HCT 45.7 05/05/2014 0900   PLT 247 11/20/2023 0840   PLT 251 05/05/2014 0900   MCV 103.8 (H) 11/20/2023 0840   MCV 102 (H) 05/05/2014 0900   MCH 34.4 (H) 11/20/2023 0840   MCHC 33.1 11/20/2023 0840   RDW 14.0 11/20/2023 0840   RDW 14.4 05/05/2014 0900   LYMPHSABS 2.2 04/02/2012 0846   MONOABS 0.6 04/02/2012 0846   EOSABS 0.2 04/02/2012 0846   BASOSABS 0.0 04/02/2012 0846    CMP     Component Value Date/Time   NA 139 11/20/2023 0840   NA 138 05/05/2014 0900   K 4.0 11/20/2023 0840   K 4.1 05/05/2014 0900   CL 104 11/20/2023 0840   CL 111 (H) 05/05/2014 0900   CO2 27 11/20/2023 0840   CO2 24 05/05/2014 0900   GLUCOSE 112 (H) 11/20/2023 0840   GLUCOSE 123 (H) 05/05/2014 0900   BUN 18 11/20/2023 0840   BUN 12 05/05/2014 0900   CREATININE 0.73 11/20/2023 0840   CREATININE 0.74 05/05/2014 0900   CALCIUM 8.7 (L) 11/20/2023 0840   CALCIUM 8.1 (L) 05/05/2014 0900   PROT 6.8 11/20/2023 0840   PROT 6.9 05/05/2014 0900   ALBUMIN 3.5 11/20/2023 0840   ALBUMIN 3.2 (L) 05/05/2014 0900   AST 28 11/20/2023 0840   AST 26 05/05/2014 0900   ALT 24 11/20/2023 0840   ALT 23 05/05/2014 0900   ALKPHOS 64 11/20/2023 0840   ALKPHOS 61 05/05/2014 0900   BILITOT 0.7 11/20/2023 0840   BILITOT 0.3 05/05/2014 0900   GFRNONAA >60 11/20/2023 0840   GFRNONAA >60 05/05/2014 0900   GFRAA >60 04/18/2020 0347   GFRAA >60 05/05/2014 0900   Imaging: DG Chest 2 View CLINICAL DATA:   Chest pain.  EXAM: CHEST - 2 VIEW  COMPARISON:  01/26/2023.  FINDINGS: Bilateral lung fields are clear. Bilateral costophrenic angles are clear.  Normal cardio-mediastinal silhouette.  No acute osseous abnormalities.  The soft tissues are within normal limits.  IMPRESSION: No active cardiopulmonary disease.  Electronically Signed   By: Jules Schick M.D.   On: 11/20/2023 09:39  EKG: personally reviewed my interpretation is sinus rhythm  ASSESSMENT & PLAN:   Assessment & Plan by Problem: Principal Problem:   Chest pain Active Problems:   Alcohol abuse   Tobacco abuse   Hyperlipidemia   Hypertension  Shakeitha Umbaugh is a 48 y.o. person living with a history of NSTEMI, asthma,  tobacco use disorder, alcohol use disorder, prediabetes who presented with chest pain and shortness of breath and is admitted for chest pain on hospital day 0  Chest pain History of NSTEMI Hemodynamically stable. Symptoms have resolved since arrival in the emergency department.  Cardiac workup so far is unremarkable (troponins x 2, EKG with sinus rhythm).  Possible vasospasm versus unstable angina. Cardiology consulted in the ED, recommended medicine admission for workup.  - Cardiology consulted, appreciate recs - Will check CBC, CMP, Mag in AM - TSH, Urine drug screen - Tylenol 650 mg q6h prn - Cardiac monitoring  Tobacco use disorder ~30 pack year history, currently smoking 1-2 ppd. Spoke with her about the importance of smoking cessation, seems to be in precontemplation stage.  - Nicotine patch ordered  Alcohol use disorder Similarly in precontemplation stage. Previously stopped drinking for some time, but restarted again. Macrocytosis 103.8.  - Will check vitamin B9, B12, liver enzymes - Thiamine, folate, multivitamin - CIWA without ativan - Offer naltrexone at discharge  Chronic conditions: Asthma: lungs CTA on exam, satting well on RA. Continue albuterol prn.  Prediabetes: A1c  6.2 nine months ago. Not on any medications. Will recheck A1c.  Hyperlipidemia: LDL 144, HDL 45, triglycerides 172 in May 2024. Previously on a statin, has not been taking it. Will recheck a lipid panel.  Hypertension: hypertensive on presentation, now normotensive. Not currently taking any medications. Previously controlled with olmesartan 5 mg daily and HCTZ 25 mg daily. Will monitor.   Diet: Heart Healthy VTE: SCDs IVF: None Code: Full  Prior to Admission Living Arrangement: Home, living with husband Anticipated Discharge Location: Home Barriers to Discharge: Medical workup  Dispo: Admit patient to Observation with expected length of stay less than 2 midnights.  Signed: Annett Fabian, MD Internal Medicine Resident, PGY-1 [facility]  Pager: [phone]  11/20/2023, 3:01 PM

## 2023-11-20 NOTE — Hospital Course (Addendum)
 High risk chest pain?  Chest tighness and shortness of breath   Weh nthis first started she was diaphretic nitroglycerin and aspirin   6L of O2   Feeling better Took her off Oxygen    48 year old female with past medical history of hypertension, tobacco abuse, prediabetes, hyperlipidemia, and asthma.  Patient was admitted on 01/26/2023 with concerns of NSTEMI and went through cardiac catheterization.  Patient was found to have mild obstructive coronary disease.  Patient has not induced vasospasm of the RCA.  Patient had blood pressure controlled with olmesartan 5 mg daily and HCTZ 25 mg daily.  Patient was hypokalemic at the time.  Medications: Albuterol inhaler- one time a day or once every other day  Aspirin 81 mg daily Aspirin-caffeine- BC arthritis 2 pills daily  Imdur 15 mg daily Olmesartan 5 mg daily Rosuvastatin 10 mg daily  This morning she notes that she was trying to get ready for work. She states at 530-6am she became shortness of breath. She notes that she was trying to talk to the new employee at 6am and noticed she could not keep a conversation. She notes at 7am she started to get lightheaded and felt her heart racing. Started out of the blue. Did not get better and at 10am called EMS.  She reports chest tightness and right arm pain. The vision and heart racing started at 7. All started with her shortness of breath. She reports some nausea but not vomiting. No recent illness. Center chest tightness and right shoulder pain. No recent injury. New non stop yawning. Hands and feet have been tingling. She reports having  back issues.  Has not been taking her medications since last August. She is unsure why sh stopped taking any of the other medications.  Family: Mom: CAD with stenting, Skin cancer (Melanoma, Basal, squamous)  Sister: Stroke  Maternal Grandpa: CAD with CABG   Lives with: Husband  Occupation: Social research officer, government at [employer]  Support: Good support in her  family  Substances: Smokes 1 ppd and sometimes 2ppd for 27 years. Thought about stopping but not ready for it at this time. Drinks beer every night (6 beers mostly every night) Tried to cut down before. No other drug use.   Code: Full

## 2023-11-20 NOTE — ED Provider Notes (Signed)
 [place] EMERGENCY DEPARTMENT AT [facility] Provider Note   CSN: [id number] Arrival date & time: 11/20/23  0825     History  No chief complaint on file.   Ann Delacruz is a 48 y.o. female with past medical history of asthma, NSTEMI, hypertension reporting to emergency room with complaint of shortness of breath.  Shortness of breath, chest tightness started this morning when she started walking around.  She reports this is associated with right shoulder pain radiating down her right arm as well as epigastric discomfort and nausea.  Comfort was so bad she felt like she could not speak or breathe. She called EMS -who administered nitro and aspirin as well as putting her on 6 L nasal cannula for discomfort.  Patient reports after receiving nitro her difficulty breathing as well as discomfort in her shoulder and epigastric area improved. Reports this feels like prior NSTEMI. Has not had recent SOB or CP with exertion. Has not seen cards since 8/24, has not taken medications since "summer time." Denies any recent URI-like symptoms, cough, abdominal pain, vomiting or diarrhea.  HPI     Home Medications Prior to Admission medications   Medication Sig Start Date End Date Taking? Authorizing Provider  albuterol (PROVENTIL HFA;VENTOLIN HFA) 108 (90 BASE) MCG/ACT inhaler Inhale 2 puffs into the lungs every 6 (six) hours as needed for wheezing or shortness of breath.    [provider]  aspirin EC 81 MG tablet Take 1 tablet (81 mg total) by mouth daily. Swallow whole. 01/28/23   Ann Kitchens, PA-C  Aspirin-Caffeine (BC FAST PAIN RELIEF PO) Take 1 Package by mouth daily as needed (pain).    [provider]  gabapentin (NEURONTIN) 300 MG capsule Take by mouth See admin instructions. Take 1 capsule by mouth 3 times a day x 4 days, then take 1 capsule at bedtime x 4 days, take 1 capsule three times daily until finished Patient not taking: Reported on 04/13/2023 12/11/19    [provider]  isosorbide mononitrate (IMDUR) 30 MG 24 hr tablet Take 0.5 tablets (15 mg total) by mouth daily. 04/13/23   Sharlene Dory, NP  olmesartan (BENICAR) 5 MG tablet Take 5 mg by mouth daily.    [provider]  rosuvastatin (CRESTOR) 20 MG tablet Take 1 tablet (20 mg total) by mouth daily. 01/28/23   Ann Kitchens, PA-C      Allergies    Penicillins    Review of Systems   Review of Systems  Respiratory:  Positive for shortness of breath.     Physical Exam Updated Vital Signs BP (!) 167/84 (BP Location: Right Arm)   Pulse 98   Temp 98.2 F (36.8 C) (Oral)   Resp 17   Ht 5\' 6"  (1.676 m)   Wt 87.1 kg   SpO2 100%   BMI 30.99 kg/m  Physical Exam Vitals and nursing note reviewed.  Constitutional:      General: She is not in acute distress.    Appearance: She is not toxic-appearing.  HENT:     Head: Normocephalic and atraumatic.  Eyes:     General: No scleral icterus.    Conjunctiva/sclera: Conjunctivae normal.  Cardiovascular:     Rate and Rhythm: Normal rate and regular rhythm.     Pulses: Normal pulses.     Heart sounds: Normal heart sounds.  Pulmonary:     Effort: Pulmonary effort is normal. No respiratory distress.     Breath sounds: Normal breath sounds.  No wheezing.  Chest:     Chest wall: No tenderness.  Abdominal:     General: Abdomen is flat. Bowel sounds are normal.     Palpations: Abdomen is soft.     Tenderness: There is no abdominal tenderness.  Musculoskeletal:     Right lower leg: No edema.     Left lower leg: No edema.  Skin:    General: Skin is warm and dry.     Findings: No lesion.  Neurological:     General: No focal deficit present.     Mental Status: She is alert and oriented to person, place, and time. Mental status is at baseline.     ED Results / Procedures / Treatments   Labs (all labs ordered are listed, but only abnormal results are displayed) Labs Reviewed  COMPREHENSIVE METABOLIC PANEL - Abnormal;  Notable for the following components:      Result Value   Glucose, Bld 112 (*)    Calcium 8.7 (*)    All other components within normal limits  CBC - Abnormal; Notable for the following components:   WBC 14.5 (*)    Hemoglobin 15.3 (*)    HCT 46.2 (*)    MCV 103.8 (*)    MCH 34.4 (*)    All other components within normal limits  LIPASE, BLOOD  HCG, SERUM, QUALITATIVE  TROPONIN I (HIGH SENSITIVITY)  TROPONIN I (HIGH SENSITIVITY)    EKG EKG Interpretation Date/Time:  Monday November 20 2023 08:32:55 EDT Ventricular Rate:  96 PR Interval:  136 QRS Duration:  85 QT Interval:  365 QTC Calculation: 462 R Axis:   48  Text Interpretation: Sinus rhythm Probable left atrial enlargement RSR' in V1 or V2, right VCD or RVH Confirmed by Ann Delacruz [phone]) on 11/20/2023 12:09:28 PM  Radiology DG Chest 2 View Result Date: 11/20/2023 CLINICAL DATA:  Chest pain. EXAM: CHEST - 2 VIEW COMPARISON:  01/26/2023. FINDINGS: Bilateral lung fields are clear. Bilateral costophrenic angles are clear. Normal cardio-mediastinal silhouette. No acute osseous abnormalities. The soft tissues are within normal limits. IMPRESSION: No active cardiopulmonary disease. Electronically Signed   By: Ann Delacruz M.D.   On: 11/20/2023 09:39    Procedures Procedures    Medications Ordered in ED Medications - No data to display  ED Course/ Medical Decision Making/ A&P                                 Medical Decision Making Amount and/or Complexity of Data Reviewed Labs: ordered. Radiology: ordered.  Risk Decision regarding hospitalization.   Ann Delacruz 48 y.o. presented today for chest pain. Working DDx that I considered at this time includes, but not limited to, ACS, GERD, pe, pna, aortic dissection, pneumothorax, MSK path, anemia, esophageal rupture, CHF exacerbation, valvular disorder, myocarditis, pericarditis, endocarditis, pericardial effusion/cardiac tamponade, pulmonary edema, gastritis/PUD,  esophagitis.  Review of prior external notes: 02/10/23  Unique Tests and My Interpretation:  EKG: Rate, rhythm, axis, intervals all examined: sinus  Chest x-ray with no acute abnormality  Troponin and repeat troponin negative Lipase 25 hCG negative CBC with mild leukocytosis and no anemia CMP without significant electrolyte abnormality.  No elevation in liver enzymes.  Normal kidney function.  Problem List / ED Course / Critical interventions / Medication management  Patient presenting to ER with complaint of shortness of breath.  Patient reports this started when she woke up.  Upon my evaluation she had  no sign of respiratory distress and she was satting well on room air.  She has no increased work of breathing and lungs are clear to auscultation bilaterally with no wheezing.  She has no increased swelling of lower extremities and no sign of congestion on chest x-ray.  Her EKG shows sinus rhythm.  She denied having any chest pain, but reports "chest tightness" she also has right shoulder pain radiating to her right arm.  She is neurovascularly intact.  Strong radial pulse equal bilaterally.  She reports that she is feeling better after receiving nitro and aspirin however continues to have some mild shortness of breath.  She does not want anything for her shoulder pain.  She has not seen cardiology or followed up with cardiology in several months.  Has not taking any medications.  She is PERC negative thus doubt PE.  No sign of wheezing on exam thus doubt asthma.  No sign of fluid overload.  Does have reassuring EKG and initial troponin of 4 and repeat 6.  Feel given risk factors including hypertension, hyperlipidemia, prior NSTEMI and this feeling like her prior NSTEMI should benefit from admission and seeing cardiology. Consulted cardiology in regards to her admission.  They agreed to consult patient would like medicine to admit. Consulted admitting team who agreed to admit. Received nitro and  aspirin via EMS.  She is currently on 1 L nasal cannula, I was able to take her off [place] and she is doing well on RA, no hypoxia and no shortness of breath.  Patients vitals assessed. Upon arrival patient is hemodynamically stable.  I have reviewed the patients home medicines and have made adjustments as needed     Plan: To be admitted for high risk chest pain          Final Clinical Impression(s) / ED Diagnoses Final diagnoses:  None    Rx / DC Orders ED Discharge Orders     None         Smitty Knudsen, PA-C 11/20/23 1444    Ann Grizzle, MD 11/22/23 [phone]

## 2023-11-24 ENCOUNTER — Encounter: Payer: Self-pay | Admitting: Physician Assistant

## 2023-11-24 ENCOUNTER — Ambulatory Visit: Payer: Self-pay | Attending: Physician Assistant | Admitting: Physician Assistant

## 2023-11-24 VITALS — BP 162/98 | HR 95 | Ht 66.0 in | Wt 195.0 lb

## 2023-11-24 DIAGNOSIS — R0602 Shortness of breath: Secondary | ICD-10-CM | POA: Diagnosis not present

## 2023-11-24 DIAGNOSIS — I10 Essential (primary) hypertension: Secondary | ICD-10-CM | POA: Diagnosis not present

## 2023-11-24 DIAGNOSIS — E785 Hyperlipidemia, unspecified: Secondary | ICD-10-CM | POA: Diagnosis not present

## 2023-11-24 DIAGNOSIS — I2510 Atherosclerotic heart disease of native coronary artery without angina pectoris: Secondary | ICD-10-CM

## 2023-11-24 MED ORDER — OLMESARTAN MEDOXOMIL 20 MG PO TABS: 20.0000 mg | ORAL_TABLET | Freq: Every day | ORAL | 3 refills | Status: DC

## 2023-11-24 NOTE — Patient Instructions (Signed)
 Medication Instructions:  INCREASE OLMESARTAN TO 20 MG DAILY  RESUME IMDUR IN 1 WEEK IF BLOOD PRESSURE CONTINUE TO BE GREATER THAN 130  RESUME TAKING ROSUVASTATIN *If you need a refill on your cardiac medications before your next appointment, please call your pharmacy*   Lab Work: D-DIMER TODAY If you have labs (blood work) drawn today and your tests are completely normal, you will receive your results only by: MyChart Message (if you have MyChart) OR A paper copy in the mail If you have any lab test that is abnormal or we need to change your treatment, we will call you to review the results.   Testing/Procedures:[address] Your physician has requested that you have an echocardiogram. Echocardiography is a painless test that uses sound waves to create images of your heart. It provides your doctor with information about the size and shape of your heart and how well your heart's chambers and valves are working. This procedure takes approximately one hour. There are no restrictions for this procedure. Please do NOT wear cologne, perfume, aftershave, or lotions (deodorant is allowed). Please arrive 15 minutes prior to your appointment time.  Please note: We ask at that you not bring children with you during ultrasound (echo/ vascular) testing. Due to room size and safety concerns, children are not allowed in the ultrasound rooms during exams. Our front office staff cannot provide observation of children in our lobby area while testing is being conducted. An adult accompanying a patient to their appointment will only be allowed in the ultrasound room at the discretion of the ultrasound technician under special circumstances. We apologize for any inconvenience.    Follow-Up: At [facility], you and your health needs are our priority.  As part of our continuing mission to provide you with exceptional heart care, we have created designated Provider Care Teams.  These Care  Teams include your primary Cardiologist (physician) and Advanced Practice Providers (APPs -  Physician Assistants and Nurse Practitioners) who all work together to provide you with the care you need, when you need it.  Your next appointment:   4 week(s) AFTER ECHOCARDIOGRAM  Provider:   Azalee Course, PA    Other Instructions

## 2023-11-24 NOTE — Progress Notes (Signed)
 Cardiology Office Note:  .   Date:  11/26/2023  ID:  Ann Delacruz, DOB Nov 30, 1975, MRN [id number] PCP: Patient, No Pcp Per  [facility] Providers Cardiologist:  Marjo Bicker, MD     History of Present Illness: .   Ann Delacruz is a 48 y.o. female with past medical history of CAD, hypertension, hyperlipidemia, prediabetes, asthma, history of tobacco abuse.  Patient was admitted in May 2024 with chest pain.  EKG was negative.  Troponin went up to 300.  She underwent cardiac catheterization that showed 30% stenosis in distal left circumflex artery, 10% in the RCA.  Overall very mild CAD.  She did have induced spasm of the RCA.  Dr. [place] suspected initial event may have been due to coronary vasospasm.  HCTZ discontinued due to profound hypokalemia.  She was started on Imdur 30 mg daily.  Statin was added.  Echocardiogram obtained within the admission on 01/27/2023 showed EF 60 to 65%, no regional wall motion abnormality, normal RV, trivial MR.  She was last seen by Sharlene Dory NP on 04/13/2019 for at which time she was complaining of some dizziness, Imdur was reduced to 15 mg daily.  More recently, patient presented to the ED on 11/20/2023 with shortness of breath.  Troponin negative x 2.  Lipase negative.  Pregnancy test negative.  White blood cell count 14.5.  Hemoglobin 15.3.  Creatinine 0.73.  Normal electrolyte.  Chest x-ray normal.  She was admitted by internal medicine service however left AGAINST MEDICAL ADVICE.  Patient presents today for follow-up.  She continued to have tightness in her chest since Monday.  Given the 2 negative troponin, suspicion for ACS is very low.  During the event, she also had blurry vision.  She has been taking 3 tablet of the 5 mg olmesartan, despite so, blood pressure remained in the 140 to 190s range.  I recommended increase olmesartan to 20 mg daily.  She says that symptoms started Monday while she was talking to a new hire.  She became even  increasingly short of breath and had chest pressure.  She denies any chest pain but described more of a constant pressure sensation.  Abnormal blood work in the hospital included the chronically elevated white blood cell count, she says she had a prior history of leukocytosis and was seen by her PCP, however there was no finding to explain the elevation of the white blood cell count.  I will obtain a D-dimer to rule out possibility of PE.  I also recommended echocardiogram.  About a week after she started on the olmesartan, she can resume 15 mg of Imdur if systolic blood pressure is still 130 or higher.  She has not been taking the rosuvastatin, I asked her to resume rosuvastatin as well.  On physical exam, her heart rate is regular, I do not hear any significant heart murmur.  There is no carotid bruit.  I will see the patient back in 4 weeks.  She has been encouraged to bring her home blood pressure cuff so we can calibrate it against the 1 we have in the office.  ROS:   Patient complained of shortness of breath started on Monday.  She also has constant chest tightness.  She has no lower extremity edema, orthopnea or PND.  Studies Reviewed: .        Cardiac Studies & Procedures   ______________________________________________________________________________________________ CARDIAC CATHETERIZATION  CARDIAC CATHETERIZATION 01/27/2023  Narrative   Dist Cx lesion is 30% stenosed.  Ost RCA to Prox RCA lesion is 10% stenosed.  1.  Very mild obstructive coronary artery disease. 2.  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 3.  LVEDP of 8 mmHg with preserved LV function with no evidence of Takotsubo cardiomyopathy.  Recommendation: Medical therapy.  Findings Coronary Findings Diagnostic  Dominance: Right  Left Circumflex Dist Cx lesion is 30% stenosed.  Right Coronary Artery Ost RCA to Prox RCA lesion is 10%  stenosed.  Intervention  No interventions have been documented.     ECHOCARDIOGRAM  ECHOCARDIOGRAM COMPLETE 01/27/2023  Narrative ECHOCARDIOGRAM REPORT    Patient Name:   Ann Delacruz Date of Exam: 01/27/2023 Medical Rec #:  [id number]          Height:       66.0 in Accession #:    [id number]         Weight:       189.0 lb Date of Birth:  [birth year]          BSA:          1.952 m Patient Age:    47 years           BP:           113/64 mmHg Patient Gender: F                  HR:           75 bpm. Exam Location:  Inpatient  Procedure: 2D Echo, Cardiac Doppler and Color Doppler  Indications:    NSTEMI I21.4  History:        Patient has no prior history of Echocardiogram examinations. Previous Myocardial Infarction; Risk Factors:Hypertension.  Sonographer:    Lucendia Herrlich Referring Phys: Randall An, M  IMPRESSIONS   1. Left ventricular ejection fraction, by estimation, is 60 to 65%. The left ventricle has normal function. The left ventricle has no regional wall motion abnormalities. Left ventricular diastolic parameters were normal. 2. Right ventricular systolic function is normal. The right ventricular size is normal. 3. The mitral valve is normal in structure. Trivial mitral valve regurgitation. No evidence of mitral stenosis. 4. The aortic valve is normal in structure. Aortic valve regurgitation is not visualized. No aortic stenosis is present. 5. The inferior vena cava is normal in size with greater than 50% respiratory variability, suggesting right atrial pressure of 3 mmHg.  FINDINGS Left Ventricle: Left ventricular ejection fraction, by estimation, is 60 to 65%. The left ventricle has normal function. The left ventricle has no regional wall motion abnormalities. The left ventricular internal cavity size was normal in size. There is no left ventricular hypertrophy. Left ventricular diastolic parameters were normal.  Right Ventricle: The right  ventricular size is normal. No increase in right ventricular wall thickness. Right ventricular systolic function is normal.  Left Atrium: Left atrial size was normal in size.  Right Atrium: Right atrial size was normal in size.  Pericardium: There is no evidence of pericardial effusion.  Mitral Valve: The mitral valve is normal in structure. Trivial mitral valve regurgitation. No evidence of mitral valve stenosis.  Tricuspid Valve: The tricuspid valve is normal in structure. Tricuspid valve regurgitation is trivial. No evidence of tricuspid stenosis.  Aortic Valve: The aortic valve is normal in structure. Aortic valve regurgitation is not visualized. No aortic stenosis is present. Aortic valve peak gradient measures 9.0 mmHg.  Pulmonic Valve: The pulmonic valve was normal in structure. Pulmonic valve regurgitation is  not visualized. No evidence of pulmonic stenosis.  Aorta: The aortic root is normal in size and structure.  Venous: The inferior vena cava is normal in size with greater than 50% respiratory variability, suggesting right atrial pressure of 3 mmHg.  IAS/Shunts: No atrial level shunt detected by color flow Doppler.   LEFT VENTRICLE PLAX 2D LVIDd:         4.40 cm   Diastology LVIDs:         2.90 cm   LV e' medial:    10.30 cm/s LV PW:         0.90 cm   LV E/e' medial:  8.4 LV IVS:        0.70 cm   LV e' lateral:   11.90 cm/s LVOT diam:     2.10 cm   LV E/e' lateral: 7.3 LV SV:         75 LV SV Index:   38 LVOT Area:     3.46 cm   RIGHT VENTRICLE             IVC RV S prime:     13.80 cm/s  IVC diam: 2.20 cm TAPSE (M-mode): 2.2 cm  LEFT ATRIUM             Index        RIGHT ATRIUM           Index LA diam:        3.70 cm 1.90 cm/m   RA Area:     17.30 cm LA Vol (A2C):   39.4 ml 20.18 ml/m  RA Volume:   43.10 ml  22.08 ml/m LA Vol (A4C):   36.5 ml 18.70 ml/m LA Biplane Vol: 39.6 ml 20.28 ml/m AORTIC VALVE AV Area (Vmax): 2.66 cm AV Vmax:        150.00  cm/s AV Peak Grad:   9.0 mmHg LVOT Vmax:      115.00 cm/s LVOT Vmean:     73.900 cm/s LVOT VTI:       0.217 m  AORTA Ao Root diam: 3.10 cm Ao Asc diam:  2.80 cm  MITRAL VALVE               TRICUSPID VALVE MV Area (PHT): 3.53 cm    TR Peak grad:   18.0 mmHg MV Decel Time: 215 msec    TR Vmax:        212.00 cm/s MV E velocity: 87.00 cm/s MV A velocity: 62.40 cm/s  SHUNTS MV E/A ratio:  1.39        Systemic VTI:  0.22 m Systemic Diam: 2.10 cm  Arvilla Meres MD Electronically signed by Arvilla Meres MD Signature Date/Time: 01/27/2023/3:39:53 PM    Final          ______________________________________________________________________________________________      Risk Assessment/Calculations:            Physical Exam:   VS:  BP (!) 162/98 (BP Location: Right Arm, Patient Position: Sitting, Cuff Size: Normal)   Pulse 95   Ht 5\' 6"  (1.676 m)   Wt 195 lb (88.5 kg)   SpO2 97%   BMI 31.47 kg/m    Wt Readings from Last 3 Encounters:  11/24/23 195 lb (88.5 kg)  11/20/23 192 lb (87.1 kg)  04/13/23 195 lb 12.8 oz (88.8 kg)    GEN: Well nourished, well developed in no acute distress NECK: No JVD; No carotid bruits CARDIAC: RRR, no murmurs, rubs, gallops RESPIRATORY:  Clear  to auscultation without rales, wheezing or rhonchi  ABDOMEN: Soft, non-tender, non-distended EXTREMITIES:  No edema; No deformity   ASSESSMENT AND PLAN: .    Shortness of breath: Occurred with constant chest tightness and shortness of breath while talking to a new hire at work on Monday.  Workup in the ED was reassuring other than chronically elevated white blood cell count.  Still troponin negative x 2.  Obtain D-dimer.  Obtain echocardiogram.  Blood pressure has been elevated recently, see below for blood pressure medication titration  Coronary Artery Disease (CAD) Mild CAD with 30% stenosis in distal left circumflex and 10% in right coronary artery. Two negative troponin tests reduce suspicion  for acute coronary syndrome. - Resume rosuvastatin for cholesterol management. - Restart Imdur 15 mg if systolic blood pressure remains 130 or higher after one week on increased olmesartan.  Hypertension Persistent hypertension with readings from 162/98 to 212/122 despite olmesartan. Blood pressure control crucial to prevent complications. - Increase olmesartan to 20 mg daily. - Maintain a blood pressure diary with home readings. - Follow up in four weeks to assess blood pressure control and adjust medication as needed.  Hyperlipidemia: Resume rosuvastatin.       Dispo: Follow-up in 4 weeks  Signed, Azalee Course, PA

## 2023-11-25 LAB — D-DIMER, QUANTITATIVE: D-DIMER: <0.2 mg{FEU}/L (ref 0.00–0.49)

## 2023-11-30 NOTE — Progress Notes (Signed)
 I have called and spoke with Ann Delacruz, I informed her that she actually had a CTA Chest Abdominal Pelvis around May of 2024 which showed stable small pulmonary nodules consistent with benign etiology, therefore no repeat CT is needed.

## 2023-12-18 ENCOUNTER — Ambulatory Visit (HOSPITAL_COMMUNITY): Attending: Physician Assistant

## 2023-12-18 DIAGNOSIS — R0602 Shortness of breath: Secondary | ICD-10-CM | POA: Diagnosis not present

## 2023-12-18 LAB — ECHOCARDIOGRAM COMPLETE
Area-P 1/2: 3.48 cm2
S' Lateral: 2.4 cm

## 2023-12-19 NOTE — Progress Notes (Signed)
 Addendum: trivial mitral valve leakage. No significant finding to explain shortness of breath

## 2023-12-28 ENCOUNTER — Ambulatory Visit: Attending: Physician Assistant | Admitting: Physician Assistant

## 2023-12-28 NOTE — Progress Notes (Signed)
 This encounter was created in error - please disregard.

## 2024-01-22 ENCOUNTER — Ambulatory Visit
Admission: EM | Admit: 2024-01-22 | Discharge: 2024-01-22 | Disposition: A | Discharge status: Home / Self Care | Attending: Emergency Medicine | Admitting: Emergency Medicine

## 2024-01-22 DIAGNOSIS — S30861A Insect bite (nonvenomous) of abdominal wall, initial encounter: Secondary | ICD-10-CM | POA: Diagnosis not present

## 2024-01-22 DIAGNOSIS — L03319 Cellulitis of trunk, unspecified: Secondary | ICD-10-CM

## 2024-01-22 DIAGNOSIS — Z23 Encounter for immunization: Secondary | ICD-10-CM

## 2024-01-22 DIAGNOSIS — W57XXXA Bitten or stung by nonvenomous insect and other nonvenomous arthropods, initial encounter: Secondary | ICD-10-CM | POA: Diagnosis not present

## 2024-01-22 MED ORDER — TETANUS-DIPHTH-ACELL PERTUSSIS 5-2.5-18.5 LF-MCG/0.5 IM SUSY
0.5000 mL | PREFILLED_SYRINGE | Freq: Once | INTRAMUSCULAR | Status: AC
  Administered 2024-01-22: 0.5 mL via INTRAMUSCULAR

## 2024-01-22 MED ORDER — DOXYCYCLINE HYCLATE 100 MG PO CAPS: 100.0000 mg | ORAL_CAPSULE | Freq: Two times a day (BID) | ORAL | 0 refills | Status: AC

## 2024-01-22 NOTE — ED Provider Notes (Signed)
 Arlander Bellman    CSN: [id number] Arrival date & time: 01/22/24  1316      History   Chief Complaint Chief Complaint  Patient presents with   Insect Bite    HPI Ann Delacruz is a 48 y.o. female.  Patient presents with an insect bite on her left side x 2 to 3 days.  She did not see what bit her; no tick removal.  The area is tender, warm, red, pruritic.  The redness is spreading.  No fever or drainage.  Treating with topical antibiotic.  Last tetanus 2017.  The history is provided by the patient and medical records.    Past Medical History:  Diagnosis Date   Asthma    Hypertension     Patient Active Problem List   Diagnosis Date Noted   Atypical chest pain 11/20/2023   Tobacco abuse 01/27/2023   Chest pain 01/27/2023   Hyperlipidemia 01/27/2023   Hypertension 01/27/2023   Substance induced mood disorder (HCC) 03/01/2016   Alcohol abuse 03/01/2016   Suicidal ideation 03/01/2016    Past Surgical History:  Procedure Laterality Date   arm surgery     CESAREAN SECTION     LEFT HEART CATH AND CORONARY ANGIOGRAPHY N/A 01/27/2023   Procedure: LEFT HEART CATH AND CORONARY ANGIOGRAPHY;  Surgeon: Kyra Phy, MD;  Location: [facility];  Service: Cardiovascular;  Laterality: N/A;   LEG SURGERY      OB History   No obstetric history on file.      Home Medications    Prior to Admission medications   Medication Sig Start Date End Date Taking? Authorizing Provider  doxycycline  (VIBRAMYCIN ) 100 MG capsule Take 1 capsule (100 mg total) by mouth 2 (two) times daily for 7 days. 01/22/24 01/29/24 Yes Wellington Half, NP  albuterol (PROVENTIL HFA;VENTOLIN HFA) 108 (90 BASE) MCG/ACT inhaler Inhale 2 puffs into the lungs every 6 (six) hours as needed for wheezing or shortness of breath.    [provider]  aspirin  EC 81 MG tablet Take 1 tablet (81 mg total) by mouth daily. Swallow whole. 01/28/23   Burnetta Cart, PA-C  Aspirin -Caffeine (BC FAST PAIN  RELIEF PO) Take 1 Package by mouth daily as needed (pain).    [provider]  isosorbide  mononitrate (IMDUR ) 30 MG 24 hr tablet Take 0.5 tablets (15 mg total) by mouth daily. 04/13/23   Lasalle Pointer, NP  olmesartan  (BENICAR ) 20 MG tablet Take 1 tablet (20 mg total) by mouth daily. 11/24/23   Meng, Hao, PA  rosuvastatin  (CRESTOR ) 20 MG tablet Take 1 tablet (20 mg total) by mouth daily. 01/28/23   Burnetta Cart, PA-C    Family History History reviewed. No pertinent family history.  Social History Social History   Tobacco Use   Smoking status: Every Day    Current packs/day: 2.00    Types: Cigarettes   Smokeless tobacco: Never  Vaping Use   Vaping status: Never Used  Substance Use Topics   Alcohol use: Not Currently    Comment: occasionally   Drug use: No     Allergies   Penicillins   Review of Systems Review of Systems  Constitutional:  Negative for chills and fever.  Skin:  Positive for color change and wound.  Neurological:  Negative for weakness and numbness.     Physical Exam Triage Vital Signs ED Triage Vitals  Encounter Vitals Group     BP 01/22/24 1358 135/87  Systolic BP Percentile --      Diastolic BP Percentile --      Pulse Rate 01/22/24 1358 79     Resp 01/22/24 1358 18     Temp 01/22/24 1358 98.3 F (36.8 C)     Temp src --      SpO2 01/22/24 1358 98 %     Weight --      Height --      Head Circumference --      Peak Flow --      Pain Score 01/22/24 1357 3     Pain Loc --      Pain Education --      Exclude from Growth Chart --    No data found.  Updated Vital Signs BP 135/87   Pulse 79   Temp 98.3 F (36.8 C)   Resp 18   LMP 02/16/2022   SpO2 98%   Visual Acuity Right Eye Distance:   Left Eye Distance:   Bilateral Distance:    Right Eye Near:   Left Eye Near:    Bilateral Near:     Physical Exam Constitutional:      General: She is not in acute distress. HENT:     Mouth/Throat:     Mouth: Mucous membranes  are moist.  Cardiovascular:     Rate and Rhythm: Normal rate and regular rhythm.  Pulmonary:     Effort: Pulmonary effort is normal. No respiratory distress.  Skin:    General: Skin is warm and dry.     Findings: Erythema and lesion present.     Comments: Left side has small lesion with surrounding erythema; no drainage.  See picture.  Neurological:     Mental Status: She is alert.      UC Treatments / Results  Labs (all labs ordered are listed, but only abnormal results are displayed) Labs Reviewed - No data to display  EKG   Radiology No results found.  Procedures Procedures (including critical care time)  Medications Ordered in UC Medications  Tdap (BOOSTRIX ) injection 0.5 mL (0.5 mLs Intramuscular Given 01/22/24 1412)    Initial Impression / Assessment and Plan / UC Course  I have reviewed the triage vital signs and the nursing notes.  Pertinent labs & imaging results that were available during my care of the patient were reviewed by me and considered in my medical decision making (see chart for details).    Cellulitis due to insect bite on left side of trunk.  Afebrile and vital signs are stable.  Tetanus updated today.  Treating with doxycycline .  Education provided on cellulitis and insect bite.  Instructed patient to follow-up with her PCP.  ED precautions given.  She agrees to plan of care.  Final Clinical Impressions(s) / UC Diagnoses   Final diagnoses:  Cellulitis of trunk, unspecified site of trunk  Insect bite of abdominal wall, initial encounter     Discharge Instructions      Your tetanus was updated today.  Take the doxycycline  as directed.  Follow-up with your primary care provider.  Go to the emergency department if you have worsening symptoms.   ED Prescriptions     Medication Sig Dispense Auth. Provider   doxycycline  (VIBRAMYCIN ) 100 MG capsule Take 1 capsule (100 mg total) by mouth 2 (two) times daily for 7 days. 14 capsule Wellington Half,  NP      PDMP not reviewed this encounter.   Wellington Half, NP 01/22/24 [phone]

## 2024-01-22 NOTE — ED Triage Notes (Signed)
 Patient to Urgent Care with complaints of an insect bite present to her left ribs. No drainage. Large red/ raised area.   Reports she noticed the area Saturday morning.

## 2024-01-22 NOTE — Discharge Instructions (Addendum)
 Your tetanus was updated today.  Take the doxycycline  as directed.  Follow-up with your primary care provider.  Go to the emergency department if you have worsening symptoms.

## 2024-02-19 DIAGNOSIS — R519 Headache, unspecified: Secondary | ICD-10-CM | POA: Diagnosis not present

## 2024-02-19 DIAGNOSIS — R220 Localized swelling, mass and lump, head: Secondary | ICD-10-CM | POA: Diagnosis not present

## 2024-02-19 DIAGNOSIS — W57XXXA Bitten or stung by nonvenomous insect and other nonvenomous arthropods, initial encounter: Secondary | ICD-10-CM | POA: Diagnosis not present

## 2024-02-19 DIAGNOSIS — S30861A Insect bite (nonvenomous) of abdominal wall, initial encounter: Secondary | ICD-10-CM | POA: Diagnosis not present

## 2024-02-19 DIAGNOSIS — R5383 Other fatigue: Secondary | ICD-10-CM | POA: Diagnosis not present

## 2024-03-04 DIAGNOSIS — S29009A Unspecified injury of muscle and tendon of unspecified wall of thorax, initial encounter: Secondary | ICD-10-CM | POA: Diagnosis not present

## 2024-03-04 DIAGNOSIS — S40021A Contusion of right upper arm, initial encounter: Secondary | ICD-10-CM | POA: Diagnosis not present

## 2024-03-04 DIAGNOSIS — Z7982 Long term (current) use of aspirin: Secondary | ICD-10-CM | POA: Diagnosis not present

## 2024-03-04 DIAGNOSIS — Z880 Allergy status to penicillin: Secondary | ICD-10-CM | POA: Diagnosis not present

## 2024-03-04 DIAGNOSIS — F17210 Nicotine dependence, cigarettes, uncomplicated: Secondary | ICD-10-CM | POA: Diagnosis not present

## 2024-03-04 DIAGNOSIS — T7411XA Adult physical abuse, confirmed, initial encounter: Secondary | ICD-10-CM | POA: Diagnosis not present

## 2024-03-04 DIAGNOSIS — I10 Essential (primary) hypertension: Secondary | ICD-10-CM | POA: Diagnosis not present

## 2024-03-04 DIAGNOSIS — S20212A Contusion of left front wall of thorax, initial encounter: Secondary | ICD-10-CM | POA: Diagnosis not present

## 2024-03-04 DIAGNOSIS — S0003XA Contusion of scalp, initial encounter: Secondary | ICD-10-CM | POA: Diagnosis not present

## 2024-03-04 DIAGNOSIS — S0990XA Unspecified injury of head, initial encounter: Secondary | ICD-10-CM | POA: Diagnosis not present

## 2024-03-04 DIAGNOSIS — Z7951 Long term (current) use of inhaled steroids: Secondary | ICD-10-CM | POA: Diagnosis not present

## 2024-03-04 DIAGNOSIS — R519 Headache, unspecified: Secondary | ICD-10-CM | POA: Diagnosis not present

## 2024-03-04 DIAGNOSIS — M79602 Pain in left arm: Secondary | ICD-10-CM | POA: Diagnosis not present

## 2024-03-04 DIAGNOSIS — R079 Chest pain, unspecified: Secondary | ICD-10-CM | POA: Diagnosis not present

## 2024-03-04 DIAGNOSIS — S2020XA Contusion of thorax, unspecified, initial encounter: Secondary | ICD-10-CM | POA: Diagnosis not present

## 2024-03-04 DIAGNOSIS — S40022A Contusion of left upper arm, initial encounter: Secondary | ICD-10-CM | POA: Diagnosis not present

## 2024-05-16 ENCOUNTER — Other Ambulatory Visit: Payer: Self-pay | Admitting: Nurse Practitioner

## 2024-07-01 ENCOUNTER — Ambulatory Visit: Attending: Nurse Practitioner | Admitting: Nurse Practitioner

## 2024-07-01 ENCOUNTER — Encounter: Payer: Self-pay | Admitting: Nurse Practitioner

## 2024-07-01 VITALS — BP 148/90 | HR 102 | Ht 66.0 in | Wt 197.0 lb

## 2024-07-01 DIAGNOSIS — R42 Dizziness and giddiness: Secondary | ICD-10-CM | POA: Diagnosis not present

## 2024-07-01 DIAGNOSIS — E785 Hyperlipidemia, unspecified: Secondary | ICD-10-CM

## 2024-07-01 DIAGNOSIS — I201 Angina pectoris with documented spasm: Secondary | ICD-10-CM

## 2024-07-01 DIAGNOSIS — I2510 Atherosclerotic heart disease of native coronary artery without angina pectoris: Secondary | ICD-10-CM

## 2024-07-01 DIAGNOSIS — I25111 Atherosclerotic heart disease of native coronary artery with angina pectoris with documented spasm: Secondary | ICD-10-CM | POA: Diagnosis not present

## 2024-07-01 DIAGNOSIS — I10 Essential (primary) hypertension: Secondary | ICD-10-CM

## 2024-07-01 DIAGNOSIS — R002 Palpitations: Secondary | ICD-10-CM

## 2024-07-01 DIAGNOSIS — Z720 Tobacco use: Secondary | ICD-10-CM

## 2024-07-01 MED ORDER — ROSUVASTATIN CALCIUM 40 MG PO TABS
40.0000 mg | ORAL_TABLET | Freq: Every day | ORAL | 3 refills | Status: AC
Start: 2024-07-01 — End: ?

## 2024-07-01 MED ORDER — METOPROLOL SUCCINATE ER 25 MG PO TB24: 12.5000 mg | ORAL_TABLET | Freq: Every day | ORAL | 1 refills | Status: AC

## 2024-07-01 MED ORDER — ISOSORBIDE MONONITRATE ER 30 MG PO TB24: 15.0000 mg | ORAL_TABLET | Freq: Every day | ORAL | 3 refills | Status: AC

## 2024-07-01 MED ORDER — OLMESARTAN MEDOXOMIL 40 MG PO TABS: 40.0000 mg | ORAL_TABLET | Freq: Every day | ORAL | 3 refills | Status: AC

## 2024-07-01 NOTE — Patient Instructions (Addendum)
 Medication Instructions:  Your physician has recommended you make the following change in your medication:  Please start Metoprolol Succinate 12.5 Mg daily   Labwork: None   Testing/Procedures: None   Follow-Up: Your physician recommends that you schedule a follow-up appointment in: 6 weeks   Any Other Special Instructions Will Be Listed Below (If Applicable).  If you need a refill on your cardiac medications before your next appointment, please call your pharmacy.     (Patient Name)-_____________________________  (MRN)-_________________________     (Physician)-_________________________________   (DATE) -_________ (Blood Pressure)-_________  (Heart Rate)-_________    (DATE) -_________ (Blood Pressure)-_________  (Heart Rate)-_________    (DATE) -_________ (Blood Pressure)-_________  (Heart Rate)-_________   (DATE) -_________ (Blood Pressure)-_________  (Heart Rate)-_________    (DATE) -_________ (Blood Pressure)-_________  (Heart Rate)-_________    (DATE) -_________ (Blood Pressure)-_________  (Heart Rate)-_________   (DATE) -_________ (Blood Pressure)-_________  (Heart Rate)-_________    (DATE) -_________ (Blood Pressure)-_________  (Heart Rate)-_________    (DATE) -_________ (Blood Pressure)-_________  (Heart Rate)-_________    (DATE) -_________ (Blood Pressure)-_________  (Heart Rate)-_________    (DATE) -_________ (Blood Pressure)-_________  (Heart Rate)-_________    (DATE) -_________ (Blood Pressure)-_________  (Heart Rate)-_________    (DATE) -_________ (Blood Pressure)-_________  (Heart Rate)-_________    (DATE) -_________ (Blood Pressure)-_________  (Heart Rate)-_________

## 2024-07-01 NOTE — Progress Notes (Unsigned)
 Office Visit    Patient Name: Ann Delacruz Date of Encounter: 04/13/2023 PCP:  Patient, No Pcp Per [facility]  Cardiologist:  Diannah SHAUNNA Maywood, MD  Advanced Practice Provider:  No care team member to display Electrophysiologist:  None   Chief Complaint    Ann Delacruz is a 48 y.o. female with a hx of CAD, HTN, prediabetes, HLD, asthma, and hx of tobacco abuse, who presents today for follow-up.  Admitted May 2024 due to chest pain.  EKG was negative.  Troponins went from 147-343.  Underwent LHC that showed 30% stenosis in distal circumflex and 10% in RCA.  Had very mild obstructive CAD.  Additionally she had induced spasm RCA once catheter was inserted, no evidence of Takotsubo.  Dr. [place] suspected that initial event may have been due to coronary vasospasm.  Was started on Imdur  30 mg daily, statin added.  HCTZ was discontinued due to profound hypokalemia.  I last saw her for hospital follow-up on Feb 10, 2023.  Denied any recurrent chest pain, but admitted to occasional flutter sensation along left upper anterior chest.  Chief concern was dizziness, occurred with squatting or sitting. Admitted to significant stress and tearful during interview as she told me she was working overtime as Production designer, theatre/television/film at [employer], caregiver to husband who was recently diagnosed with dementia. Had recently quit drinking. Had been cutting back her tobacco use to 1 pack/day.  Today she presents for follow-up. Continues to note palpitations and dizziness. Dizziness is more bothersome for her, typically noticed when bending over. Denies any chest pain, shortness of breath, syncope, presyncope, orthopnea, PND, swelling or significant weight changes, acute bleeding, or claudication.    SH: Works as Art therapist at [employer] Studies Reviewed:   The following studies were reviewed today:   EKG:  EKG is not ordered today.   Echocardiogram  01/27/2023:  1. Left ventricular ejection fraction, by estimation, is 60 to 65%. The  left ventricle has normal function. The left ventricle has no regional  wall motion abnormalities. Left ventricular diastolic parameters were  normal.   2. Right ventricular systolic function is normal. The right ventricular  size is normal.   3. The mitral valve is normal in structure. Trivial mitral valve  regurgitation. No evidence of mitral stenosis.   4. The aortic valve is normal in structure. Aortic valve regurgitation is  not visualized. No aortic stenosis is present.   5. The inferior vena cava is normal in size with greater than 50%  respiratory variability, suggesting right atrial pressure of 3 mmHg.  LHC 01/27/2023:   Dist Cx lesion is 30% stenosed.   Ost RCA to Prox RCA lesion is 10% stenosed.   1.  Very mild obstructive coronary artery disease. 2.  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 3.  LVEDP of 8 mmHg with preserved LV function with no evidence of Takotsubo cardiomyopathy.   Recommendation: Medical therapy.  Review of Systems    All other systems reviewed and are otherwise negative except as noted above.  Physical Exam    VS:  LMP 02/16/2022  , BMI There is no height or weight on file to calculate BMI.  Wt Readings from Last 3 Encounters:  11/24/23 195 lb (88.5 kg)  11/20/23 192 lb (87.1 kg)  04/13/23 195 lb 12.8 oz (88.8 kg)   04/13/2023 Orthostatics vital signs today: Lying -118/72, 88 Sitting-130/78, 96 Standing-125/71, 101  GEN: Obese, 48 year old female in no acute distress. HEENT: normal. Neck: Supple, no JVD, carotid bruits, or masses. Cardiac:S1/S2, RRR, no murmurs, rubs, or gallops. No clubbing, cyanosis, edema.  Radials/PT 2+ and equal bilaterally.  Respiratory:  Respirations regular and unlabored, clear to auscultation bilaterally. MS: No deformity or atrophy. Skin: Warm and dry, no rash. Neuro:   Strength and sensation are intact. Psych: Calm, pleasant  Assessment & Plan    CAD, coronary vasospasm Stable with no anginal symptoms. No indication for ischemic evaluation.  LHC 01/2023 revealed very mild obstructive CAD with evidence of catheter induced spasm of right coronary artery, medical therapy recommended.  Continue aspirin , olmesartan , and rosuvastatin . Will reduce Imdur  to 15 mg daily to improve dizziness - see below. Heart healthy diet and regular cardiovascular exercise encouraged.    Palpitations, dizziness Does admit to occasional palpitations and dizziness with bending over, negative orthostatics on exam today.  Denies any sustained palpitations or tachycardia.  She states dizziness is not related to her blood pressure. Previous 7 day ZIO monitor results are pending. Will reduce Imdur  to 15 mg daily to see if this improves her symptoms. Does have cervical spondylosis, told her I recommend she follow-up with her Neurologist - she verbalized understanding. Conservative measures discussed.  Heart healthy diet and regular cardiovascular exercise encouraged.  ED precautions discussed.  Hypertension Blood pressure 130/72.  Discussed SBP goal is less than 130.  She states at home blood pressures are well-controlled. Discussed to monitor BP at home at least 2 hours after medications and sitting for 5-10 minutes. Reducing Imdur  as mentioned above. No medication changes at this time. She will call and let us  know if SBP remains > 130. Heart healthy diet and regular cardiovascular exercise encouraged.   Hyperlipidemia Past lipid panel showed LDL of 144.  Was started on rosuvastatin  and tolerating well.  Continue rosuvastatin .  Will obtain FLP and LFT. Heart healthy diet and regular cardiovascular exercise encouraged.   Tobacco abuse Smoking cessation encouraged and discussed.  Disposition: Follow up in 6-8 weeks with Vishnu P Mallipeddi, MD or APP.  Signed, Almarie Crate, NP 07/01/2024,  8:57 AM [facility]

## 2024-07-25 ENCOUNTER — Encounter: Payer: Self-pay | Admitting: Pharmacist

## 2024-07-25 ENCOUNTER — Telehealth: Payer: Self-pay | Admitting: Pharmacist

## 2024-07-25 ENCOUNTER — Other Ambulatory Visit (HOSPITAL_COMMUNITY): Payer: Self-pay

## 2024-07-25 ENCOUNTER — Ambulatory Visit: Attending: Cardiology | Admitting: Pharmacist

## 2024-07-25 ENCOUNTER — Telehealth: Payer: Self-pay | Admitting: Pharmacy Technician

## 2024-07-25 DIAGNOSIS — E7849 Other hyperlipidemia: Secondary | ICD-10-CM

## 2024-07-25 MED ORDER — REPATHA SURECLICK 140 MG/ML ~~LOC~~ SOAJ: 140.0000 mg | SUBCUTANEOUS | 3 refills | Status: AC

## 2024-07-25 NOTE — Assessment & Plan Note (Signed)
 Assessment:  LDL goal: <70 mg/dl last LDLc  860  mg/dl (92/7974)  Tolerates high intensity statins well without any side effects  Discussed next potential options ( Zetia, PCSK-9 inhibitors, bempedoic acid and inclisiran); cost, dosing efficacy, side effects  Reiterated on healthy lifestyle - healthy low carb low fat diet, smoking cession and limiting alcohol intake   Plan: Continue taking current medications (Crestor  40 gm daily) Start taking Repatha 140 mg every 14 days  F/u lipid lab due Feb 2026

## 2024-07-25 NOTE — Progress Notes (Signed)
 Patient ID: Ann Delacruz                 DOB: 1976-06-30                    MRN: [id number]      HPI: Ann Delacruz is a 48 y.o. female patient referred to lipid clinic by Almarie Crate, NP . PMH is significant for hx of CAD, HTN, prediabetes, HLD, asthma, and hx of tobacco abuse, who presented today for lipid clinic   Had recently quit drinking. Had been cutting back her tobacco use to 1 pack/day. Patient presented today for lipid clinic   Reviewed options for lowering LDL cholesterol, including ezetimibe, PCSK-9 inhibitors, bempedoic acid and inclisiran.  Discussed mechanisms of action, dosing, side effects and potential decreases in LDL cholesterol.  Also reviewed cost information and potential options for patient assistance.   Discussed diet in detail- suggested few changes. Reiterated on importance of reducing on alcohol intake and smoking cession  Current Medications: rosuvastatin  40 mg daily  Intolerances: none  Risk Factors: mild CAD,  LDL goal: <55 mg/dl or <29 mg/dl TG <849 mg/dl  Last lab: 92/7974 TC 775, TG 82, LDLc 139, HDLc 85  while on Rosuvastatin  40 mg daily   Diet:  B- cheese or boiled eggs  L- left over dinner quit using deli meat  D- air fired chicken shrimp or fish - likes red meat once a week - vegetables ( string beans, corn, pinto beans and other types of beans, turnip greens collard greens ) mac and cheese twice a month and fried okra twice a month  Drink- water , sweet tea twice a  month ( 2 glassless per month)   Exercise: none works 60 hours per week   Family History:  Maternal Grandfather bypass at age of 16 and second bypass at 31 had stroke during that surgery Mother: carotid artery stenosis, CAD  Father- atrial fibrilation  Social History:  Alcohol: 4 beers per day - 4 days per week  Smoking - 1 pack per day  Labs:  Lipid Panel     Component Value Date/Time   CHOL 223 (H) 01/27/2023 0413   TRIG 172 (H) 01/27/2023 0413   HDL 45  01/27/2023 0413   CHOLHDL 5.0 01/27/2023 0413   VLDL 34 01/27/2023 0413   LDLCALC 144 (H) 01/27/2023 0413    Past Medical History:  Diagnosis Date   Asthma    Hypertension     Current Outpatient Medications on File Prior to Visit  Medication Sig Dispense Refill   albuterol (PROVENTIL HFA;VENTOLIN HFA) 108 (90 BASE) MCG/ACT inhaler Inhale 2 puffs into the lungs every 6 (six) hours as needed for wheezing or shortness of breath.     aspirin  EC 81 MG tablet Take 1 tablet (81 mg total) by mouth daily. Swallow whole. 120 tablet 2   Aspirin -Caffeine (BC FAST PAIN RELIEF PO) Take 1 Package by mouth daily as needed (pain).     isosorbide  mononitrate (IMDUR ) 30 MG 24 hr tablet Take 0.5 tablets (15 mg total) by mouth daily. 45 tablet 3   metoprolol succinate (TOPROL-XL) 25 MG 24 hr tablet Take 0.5 tablets (12.5 mg total) by mouth daily. 45 tablet 1   olmesartan  (BENICAR ) 40 MG tablet Take 1 tablet (40 mg total) by mouth daily. 90 tablet 3   rosuvastatin  (CRESTOR ) 40 MG tablet Take 1 tablet (40 mg total) by mouth daily. 90 tablet 3   No current facility-administered medications on  file prior to visit.    Allergies  Allergen Reactions   Penicillins Other (See Comments)    Reaction type/severity unknown. Childhood allergy    Assessment/Plan:  1. Hyperlipidemia -  Problem  Hyperlipidemia   Current Medications: rosuvastatin  40 mg daily  Intolerances: none  Risk Factors: mild CAD,  LDL goal: <55 mg/dl or <29 mg/dl TG <849 mg/dl  Last lab: 92/7974 TC 775, TG 82, LDLc 139, HDLc 85  while on Rosuvastatin  40 mg daily     Hyperlipidemia Assessment:  LDL goal: <70 mg/dl last LDLc  860  mg/dl (92/7974)  Tolerates high intensity statins well without any side effects  Discussed next potential options ( Zetia, PCSK-9 inhibitors, bempedoic acid and inclisiran); cost, dosing efficacy, side effects  Reiterated on healthy lifestyle - healthy low carb low fat diet, smoking cession and limiting  alcohol intake   Plan: Continue taking current medications (Crestor  40 gm daily) Start taking Repatha 140 mg every 14 days  F/u lipid lab due Feb 2026      Thank you,  Robbi Blanch, Pharm.D [facility]. [facility] [address], [place], [place] [address] Phone: [phone]; Fax: [phone]

## 2024-07-25 NOTE — Telephone Encounter (Signed)
   Ran test claim for repatha. For a 28 day supply and the co-pay is 24.99 . PA is not needed at this time. Nothing saying this is a transition fill.

## 2024-07-26 NOTE — Telephone Encounter (Signed)
 Repatha initiated see OV notes for more info

## 2024-08-15 ENCOUNTER — Ambulatory Visit: Attending: Physician Assistant | Admitting: Physician Assistant
